# Patient Record
Sex: Female | Born: 1970
Health system: Southern US, Community
[De-identification: ages and names within clinical notes are randomized; demographics above are authoritative.]

## PROBLEM LIST (undated history)

## (undated) DIAGNOSIS — R112 Nausea with vomiting, unspecified: Secondary | ICD-10-CM

## (undated) DIAGNOSIS — O24419 Gestational diabetes mellitus in pregnancy, unspecified control: Secondary | ICD-10-CM

## (undated) DIAGNOSIS — A630 Anogenital (venereal) warts: Secondary | ICD-10-CM

## (undated) DIAGNOSIS — Z9889 Other specified postprocedural states: Secondary | ICD-10-CM

## (undated) DIAGNOSIS — J020 Streptococcal pharyngitis: Secondary | ICD-10-CM

## (undated) HISTORY — DX: Anogenital (venereal) warts: A63.0

## (undated) HISTORY — PX: PILONIDAL CYST EXCISION: SHX744

## (undated) HISTORY — PX: COSMETIC SURGERY: SHX468

---
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
## 1993-01-09 HISTORY — PX: WISDOM TOOTH EXTRACTION: SHX21

## 1998-01-09 DIAGNOSIS — A630 Anogenital (venereal) warts: Secondary | ICD-10-CM

## 1998-01-09 HISTORY — DX: Anogenital (venereal) warts: A63.0

## 2003-10-30 ENCOUNTER — Other Ambulatory Visit: Admission: RE | Admit: 2003-10-30 | Discharge: 2003-10-30 | Payer: Self-pay | Admitting: Obstetrics and Gynecology

## 2004-01-20 ENCOUNTER — Inpatient Hospital Stay (HOSPITAL_COMMUNITY): Admission: AD | Admit: 2004-01-20 | Discharge: 2004-01-20 | Payer: Self-pay | Admitting: Obstetrics and Gynecology

## 2004-03-15 ENCOUNTER — Inpatient Hospital Stay (HOSPITAL_COMMUNITY): Admission: AD | Admit: 2004-03-15 | Discharge: 2004-03-15 | Payer: Self-pay | Admitting: Obstetrics and Gynecology

## 2004-05-27 ENCOUNTER — Inpatient Hospital Stay (HOSPITAL_COMMUNITY): Admission: AD | Admit: 2004-05-27 | Discharge: 2004-05-29 | Payer: Self-pay | Admitting: Obstetrics & Gynecology

## 2004-07-04 ENCOUNTER — Other Ambulatory Visit: Admission: RE | Admit: 2004-07-04 | Discharge: 2004-07-04 | Payer: Self-pay | Admitting: Obstetrics and Gynecology

## 2007-01-21 ENCOUNTER — Emergency Department (HOSPITAL_COMMUNITY): Admission: EM | Admit: 2007-01-21 | Discharge: 2007-01-21 | Payer: Self-pay | Admitting: Family Medicine

## 2007-09-03 ENCOUNTER — Encounter: Admission: RE | Admit: 2007-09-03 | Discharge: 2007-09-03 | Payer: Self-pay | Admitting: Obstetrics and Gynecology

## 2010-05-27 NOTE — Op Note (Signed)
Breanna Tran, Breanna Tran              ACCOUNT NO.:  [id number]   MEDICAL RECORD NO.:  [id number]          PATIENT TYPE:  INP   LOCATION:  9137                          FACILITY:  [facility]   PHYSICIAN:  Guy Sandifer. Henderson Cloud, M.D. DATE OF BIRTH:  1970/03/30   DATE OF PROCEDURE:  05/28/2004  DATE OF DISCHARGE:                                 OPERATIVE REPORT   PROCEDURE:  Vacuum extraction.   INDICATIONS AND CONSENT:  This patient is a 40 year old married white  female, G1, P0, with an EDC of May 23, 2004, who was admitted for induction  of labor.  She progressed to complete and pushing to a +3 station.  After 2-  1/2 hours, she was fatigued.  Options were discussed.  Vacuum extraction  with a one in 40,000 severe morbidity and mortality is discussed.  All  questions were answered.   The patient was prepped for delivery.  Foley catheter had been removed  approximately 45 minutes earlier.  She was straight-catheterized.  The Kiwi  vacuum extractor is placed on the vertex with the suction being believed  between contractions.  Over the course of approximately four to five  contractions, the vertex descends well.  There is one pop-off.  The vertex  is then delivered in an OA position.  The oropharynx and nasopharynx are  suctioned.  A nuchal cord x1 is noted.  However, it is too tight to reduce  and the baby is delivered through it.  The shoulders are delivered without  difficulty.  A viable female infant, Apgars of 8 and 9 at one and five  minutes, respectively, is obtained.  Birth weight and cord pH are pending.  The placenta is three-vessel and intact.  Estimated blood loss is 500 mL.  A  midline episiotomy was performed.  There is a third degree extension just  into the capsule of the rectal sphincter.  There is a small approximately 2  cm left sulcus extension as well.  This is repaired in layers with 2-0  Rapide suture.  The patient and infant are stable in the labor and delivery   room.      JET/MEDQ  D:  05/27/2004  T:  05/28/2004  Job:  [id number]

## 2010-11-09 ENCOUNTER — Emergency Department: Payer: Self-pay | Admitting: *Deleted

## 2012-04-09 ENCOUNTER — Other Ambulatory Visit: Payer: Self-pay

## 2012-04-10 ENCOUNTER — Other Ambulatory Visit: Payer: Self-pay

## 2012-04-30 ENCOUNTER — Ambulatory Visit (INDEPENDENT_AMBULATORY_CARE_PROVIDER_SITE_OTHER): Payer: BC Managed Care – PPO | Admitting: Family Medicine

## 2012-04-30 ENCOUNTER — Encounter: Payer: Self-pay | Admitting: Family Medicine

## 2012-04-30 VITALS — BP 115/66 | HR 55 | Ht 68.0 in | Wt 182.0 lb

## 2012-04-30 DIAGNOSIS — O039 Complete or unspecified spontaneous abortion without complication: Secondary | ICD-10-CM | POA: Insufficient documentation

## 2012-04-30 NOTE — Progress Notes (Signed)
  Subjective:    Patient ID: Breanna Tran, female    DOB: 1970-07-07, 42 y.o.   MRN: [id number]  HPI New pt. Today.  2 recent miscarriages.  Believes she passed everything on 04/06/12.  Has not gotten a cycle back, but does not feel pregnant.  She is having problems with depression and feeling sad.  She is interested in having her husband get a vasectomy.  States she cannot go through another miscarriage.  Interested in having another baby, only if we can guarantee, everything will be normal. Past Medical History  Diagnosis Date  . Genital warts due to HPV (human papillomavirus) 2000    wart removed.    Past Surgical History  Procedure Laterality Date  . Appendectomy  1993  . Wisdom tooth extraction  1995     Medication List       These changes are accurate as of: 04/30/2012  9:12 AM. If you have any questions, ask your nurse or doctor.          TAKE these medications       glucosamine-chondroitin 500-400 MG tablet  Take 1 tablet by mouth 3 (three) times daily.       No Known Allergies Family History  Problem Relation Age of Onset  . Cancer Father     renal  . Cancer Maternal Grandfather     lung   History   Social History  . Marital Status: Married    Spouse Name: N/A    Number of Children: N/A  . Years of Education: N/A   Occupational History  . Not on file.   Social History Main Topics  . Smoking status: Former Smoker -- 0.50 packs/day for 15 years    Quit date: 01/10/2012  . Smokeless tobacco: Never Used  . Alcohol Use: 0.6 oz/week    1 Glasses of wine per week  . Drug Use: No  . Sexually Active: Yes    Birth Control/ Protection: Condom   Other Topics Concern  . Not on file   Social History Narrative  . No narrative on file       Review of Systems  Constitutional: Negative for fever and appetite change.  Respiratory: Negative for chest tightness, shortness of breath and wheezing.   Cardiovascular: Negative for chest pain.   Gastrointestinal: Negative for diarrhea and abdominal distention.  Genitourinary: Negative for dysuria.  Skin: Negative for rash.  Neurological: Negative for light-headedness.  Psychiatric/Behavioral: Positive for dysphoric mood. Negative for suicidal ideas and self-injury. The patient is not nervous/anxious.        Objective:   Physical Exam  Vitals reviewed. Constitutional: She is oriented to person, place, and time. She appears well-developed and well-nourished.  HENT:  Head: Normocephalic and atraumatic.  Eyes: No scleral icterus.  Neck: Neck supple.  Cardiovascular: Normal rate.   Pulmonary/Chest: Effort normal.  Abdominal: Soft.  Musculoskeletal: Normal range of motion.  Neurological: She is alert and oriented to person, place, and time.  Skin: Skin is warm and dry.  Psychiatric: She does not exhibit a depressed mood.  Tearful          Assessment & Plan:

## 2012-04-30 NOTE — Assessment & Plan Note (Signed)
Advice given at length.  Counseling done for a very long time about normal progression, when to expect her cycle, if not back in one week return for UPT and u/s.  Also discussed need for anti-depressants if not feeling better.  Advised that making permanent life decisions during this time is not good.  She needs an annual, pap smear and mammogram. Will schedule her back for this in 4 wks.

## 2012-04-30 NOTE — Patient Instructions (Signed)
Miscarriage A miscarriage is the sudden loss of an unborn baby (fetus) before the 20th week of pregnancy. Most miscarriages happen in the first 3 months of pregnancy. Sometimes, it happens before a woman even knows she is pregnant. A miscarriage is also called a "spontaneous miscarriage" or "early pregnancy loss." Having a miscarriage can be an emotional experience. Talk with your caregiver about any questions you may have about miscarrying, the grieving process, and your future pregnancy plans. CAUSES   Problems with the fetal chromosomes that make it impossible for the baby to develop normally. Problems with the baby's genes or chromosomes are most often the result of errors that occur, by chance, as the embryo divides and grows. The problems are not inherited from the parents.  Infection of the cervix or uterus.   Hormone problems.   Problems with the cervix, such as having an incompetent cervix. This is when the tissue in the cervix is not strong enough to hold the pregnancy.   Problems with the uterus, such as an abnormally shaped uterus, uterine fibroids, or congenital abnormalities.   Certain medical conditions.   Smoking, drinking alcohol, or taking illegal drugs.   Trauma.  Often, the cause of a miscarriage is unknown.  SYMPTOMS   Vaginal bleeding or spotting, with or without cramps or pain.  Pain or cramping in the abdomen or lower back.  Passing fluid, tissue, or blood clots from the vagina. DIAGNOSIS  Your caregiver will perform a physical exam. You may also have an ultrasound to confirm the miscarriage. Blood or urine tests may also be ordered. TREATMENT   Sometimes, treatment is not necessary if you naturally pass all the fetal tissue that was in the uterus. If some of the fetus or placenta remains in the body (incomplete miscarriage), tissue left behind may become infected and must be removed. Usually, a dilation and curettage (D and C) procedure is performed.  During a D and C procedure, the cervix is widened (dilated) and any remaining fetal or placental tissue is gently removed from the uterus.  Antibiotic medicines are prescribed if there is an infection. Other medicines may be given to reduce the size of the uterus (contract) if there is a lot of bleeding.  If you have Rh negative blood and your baby was Rh positive, you will need a Rh immunoglobulin shot. This shot will protect any future baby from having Rh blood problems in future pregnancies. HOME CARE INSTRUCTIONS   Your caregiver may order bed rest or may allow you to continue light activity. Resume activity as directed by your caregiver.  Have someone help with home and family responsibilities during this time.   Keep track of the number of sanitary pads you use each day and how soaked (saturated) they are. Write down this information.   Do not use tampons. Do not douche or have sexual intercourse until approved by your caregiver.   Only take over-the-counter or prescription medicines for pain or discomfort as directed by your caregiver.   Do not take aspirin. Aspirin can cause bleeding.   Keep all follow-up appointments with your caregiver.   If you or your partner have problems with grieving, talk to your caregiver or seek counseling to help cope with the pregnancy loss. Allow enough time to grieve before trying to get pregnant again.  SEEK IMMEDIATE MEDICAL CARE IF:   You have severe cramps or pain in your back or abdomen.  You have a fever.  You pass large blood clots (walnut-sized   or larger) ortissue from your vagina. Save any tissue for your caregiver to inspect.   Your bleeding increases.   You have a thick, bad-smelling vaginal discharge.  You become lightheaded, weak, or you faint.   You have chills.  MAKE SURE YOU:  Understand these instructions.  Will watch your condition.  Will get help right away if you are not doing well or get  worse. Document Released: 06/21/2000 Document Revised: 06/27/2011 Document Reviewed: 02/14/2011 [employer] Patient Information 2013 [employer].  

## 2012-04-30 NOTE — Progress Notes (Signed)
Here today for follow from [facility], she thinks she passed everything on 04/06/12, she did not go to the office or hospital.  LMP was 01/30/12.  History of [facility] 11/09/10.

## 2012-05-30 ENCOUNTER — Encounter: Payer: Self-pay | Admitting: Family Medicine

## 2012-05-30 ENCOUNTER — Ambulatory Visit (INDEPENDENT_AMBULATORY_CARE_PROVIDER_SITE_OTHER): Payer: BC Managed Care – PPO | Admitting: Family Medicine

## 2012-05-30 VITALS — BP 115/74 | HR 74 | Resp 16 | Ht 68.0 in | Wt 183.0 lb

## 2012-05-30 DIAGNOSIS — Z01419 Encounter for gynecological examination (general) (routine) without abnormal findings: Secondary | ICD-10-CM

## 2012-05-30 DIAGNOSIS — Z720 Tobacco use: Secondary | ICD-10-CM | POA: Insufficient documentation

## 2012-05-30 DIAGNOSIS — Z124 Encounter for screening for malignant neoplasm of cervix: Secondary | ICD-10-CM

## 2012-05-30 DIAGNOSIS — F17200 Nicotine dependence, unspecified, uncomplicated: Secondary | ICD-10-CM

## 2012-05-30 DIAGNOSIS — Z1151 Encounter for screening for human papillomavirus (HPV): Secondary | ICD-10-CM

## 2012-05-30 LAB — CBC
HCT: 41.9 % (ref 36.0–46.0)
Hemoglobin: 14 g/dL (ref 12.0–15.0)
MCH: 28.7 pg (ref 26.0–34.0)
MCV: 86 fL (ref 78.0–100.0)
RBC: 4.87 MIL/uL (ref 3.87–5.11)
WBC: 7.1 10*3/uL (ref 4.0–10.5)

## 2012-05-30 LAB — COMPREHENSIVE METABOLIC PANEL
ALT: 16 U/L (ref 0–35)
Alkaline Phosphatase: 51 U/L (ref 39–117)
Creat: 0.81 mg/dL (ref 0.50–1.10)
Sodium: 141 mEq/L (ref 135–145)
Total Bilirubin: 0.3 mg/dL (ref 0.3–1.2)
Total Protein: 6.9 g/dL (ref 6.0–8.3)

## 2012-05-30 LAB — LIPID PANEL
HDL: 41 mg/dL (ref >39)
LDL Cholesterol: 90 mg/dL (ref 0–99)
Total CHOL/HDL Ratio: 4.1 Ratio
VLDL: 36 mg/dL (ref 0–40)

## 2012-05-30 LAB — TSH: TSH: 1.591 u[IU]/mL (ref 0.350–4.500)

## 2012-05-30 NOTE — Progress Notes (Signed)
  Subjective:     Breanna Tran is a 42 y.o. female and is here for a comprehensive physical exam. The patient reports no problems.  History   Social History  . Marital Status: Married    Spouse Name: N/A    Number of Children: N/A  . Years of Education: N/A   Occupational History  . Not on file.   Social History Main Topics  . Smoking status: Former Smoker -- 0.50 packs/day for 15 years    Quit date: 01/10/2012  . Smokeless tobacco: Never Used  . Alcohol Use: 0.6 oz/week    1 Glasses of wine per week  . Drug Use: No  . Sexually Active: Yes    Birth Control/ Protection: Condom   Other Topics Concern  . Not on file   Social History Narrative  . No narrative on file   Health Maintenance  Topic Date Due  . Pap Smear  03/29/1990  . Tetanus/tdap  03/29/1991  . Influenza Vaccine  09/09/2012    The following portions of the patient's history were reviewed and updated as appropriate: allergies, current medications, past family history, past medical history, past social history, past surgical history and problem list.  Review of Systems A comprehensive review of systems was negative.   Objective:    BP 115/74  Pulse 74  Resp 16  Ht 5\' 8"  (1.727 m)  Wt 183 lb (83.008 kg)  BMI 27.83 kg/m2  LMP 05/10/2012  Breastfeeding? No General appearance: alert, cooperative and appears stated age Head: Normocephalic, without obvious abnormality, atraumatic Neck: no adenopathy, supple, symmetrical, trachea midline and thyroid not enlarged, symmetric, no tenderness/mass/nodules Lungs: clear to auscultation bilaterally Breasts: normal appearance, no masses or tenderness Heart: regular rate and rhythm, S1, S2 normal, no murmur, click, rub or gallop Abdomen: soft, non-tender; bowel sounds normal; no masses,  no organomegaly Pelvic: cervix normal in appearance, external genitalia normal, no adnexal masses or tenderness, no cervical motion tenderness, uterus normal size, shape, and  consistency and vagina normal without discharge Extremities: extremities normal, atraumatic, no cyanosis or edema Pulses: 2+ and symmetric Skin: Skin color, texture, turgor normal. No rashes or lesions Lymph nodes: Cervical, supraclavicular, and axillary nodes normal. Neurologic: Grossly normal    Assessment:    Healthy female exam. Husband is considering vasectomy.      Plan:    Pap smear today. Annual blood work Biochemist, clinical. See After Visit Summary for Counseling Recommendations

## 2012-05-30 NOTE — Patient Instructions (Signed)
Preventive Care for Adults, Female A healthy lifestyle and preventive care can promote health and wellness. Preventive health guidelines for women include the following key practices.  A routine yearly physical is a good way to check with your caregiver about your health and preventive screening. It is a chance to share any concerns and updates on your health, and to receive a thorough exam.  Visit your dentist for a routine exam and preventive care every 6 months. Brush your teeth twice a day and floss once a day. Good oral hygiene prevents tooth decay and gum disease.  The frequency of eye exams is based on your age, health, family medical history, use of contact lenses, and other factors. Follow your caregiver's recommendations for frequency of eye exams.  Eat a healthy diet. Foods like vegetables, fruits, whole grains, low-fat dairy products, and lean protein foods contain the nutrients you need without too many calories. Decrease your intake of foods high in solid fats, added sugars, and salt. Eat the right amount of calories for you.Get information about a proper diet from your caregiver, if necessary.  Regular physical exercise is one of the most important things you can do for your health. Most adults should get at least 150 minutes of moderate-intensity exercise (any activity that increases your heart rate and causes you to sweat) each week. In addition, most adults need muscle-strengthening exercises on 2 or more days a week.  Maintain a healthy weight. The body mass index (BMI) is a screening tool to identify possible weight problems. It provides an estimate of body fat based on height and weight. Your caregiver can help determine your BMI, and can help you achieve or maintain a healthy weight.For adults 20 years and older:  A BMI below 18.5 is considered underweight.  A BMI of 18.5 to 24.9 is normal.  A BMI of 25 to 29.9 is considered overweight.  A BMI of 30 and above is  considered obese.  Maintain normal blood lipids and cholesterol levels by exercising and minimizing your intake of saturated fat. Eat a balanced diet with plenty of fruit and vegetables. Blood tests for lipids and cholesterol should begin at age 20 and be repeated every 5 years. If your lipid or cholesterol levels are high, you are over 50, or you are at high risk for heart disease, you may need your cholesterol levels checked more frequently.Ongoing high lipid and cholesterol levels should be treated with medicines if diet and exercise are not effective.  If you smoke, find out from your caregiver how to quit. If you do not use tobacco, do not start.  If you are pregnant, do not drink alcohol. If you are breastfeeding, be very cautious about drinking alcohol. If you are not pregnant and choose to drink alcohol, do not exceed 1 drink per day. One drink is considered to be 12 ounces (355 mL) of beer, 5 ounces (148 mL) of wine, or 1.5 ounces (44 mL) of liquor.  Avoid use of street drugs. Do not share needles with anyone. Ask for help if you need support or instructions about stopping the use of drugs.  High blood pressure causes heart disease and increases the risk of stroke. Your blood pressure should be checked at least every 1 to 2 years. Ongoing high blood pressure should be treated with medicines if weight loss and exercise are not effective.  If you are 55 to 40 years old, ask your caregiver if you should take aspirin to prevent strokes.  Diabetes   screening involves taking a blood sample to check your fasting blood sugar level. This should be done once every 3 years, after age 45, if you are within normal weight and without risk factors for diabetes. Testing should be considered at a younger age or be carried out more frequently if you are overweight and have at least 1 risk factor for diabetes.  Breast cancer screening is essential preventive care for women. You should practice "breast  self-awareness." This means understanding the normal appearance and feel of your breasts and may include breast self-examination. Any changes detected, no matter how small, should be reported to a caregiver. Women in their 20s and 30s should have a clinical breast exam (CBE) by a caregiver as part of a regular health exam every 1 to 3 years. After age 40, women should have a CBE every year. Starting at age 40, women should consider having a mammography (breast X-ray test) every year. Women who have a family history of breast cancer should talk to their caregiver about genetic screening. Women at a high risk of breast cancer should talk to their caregivers about having magnetic resonance imaging (MRI) and a mammography every year.  The Pap test is a screening test for cervical cancer. A Pap test can show cell changes on the cervix that might become cervical cancer if left untreated. A Pap test is a procedure in which cells are obtained and examined from the lower end of the uterus (cervix).  Women should have a Pap test starting at age 21.  Between ages 21 and 29, Pap tests should be repeated every 2 years.  Beginning at age 30, you should have a Pap test every 3 years as long as the past 3 Pap tests have been normal.  Some women have medical problems that increase the chance of getting cervical cancer. Talk to your caregiver about these problems. It is especially important to talk to your caregiver if a new problem develops soon after your last Pap test. In these cases, your caregiver may recommend more frequent screening and Pap tests.  The above recommendations are the same for women who have or have not gotten the vaccine for human papillomavirus (HPV).  If you had a hysterectomy for a problem that was not cancer or a condition that could lead to cancer, then you no longer need Pap tests. Even if you no longer need a Pap test, a regular exam is a good idea to make sure no other problems are  starting.  If you are between ages 65 and 70, and you have had normal Pap tests going back 10 years, you no longer need Pap tests. Even if you no longer need a Pap test, a regular exam is a good idea to make sure no other problems are starting.  If you have had past treatment for cervical cancer or a condition that could lead to cancer, you need Pap tests and screening for cancer for at least 20 years after your treatment.  If Pap tests have been discontinued, risk factors (such as a new sexual partner) need to be reassessed to determine if screening should be resumed.  The HPV test is an additional test that may be used for cervical cancer screening. The HPV test looks for the virus that can cause the cell changes on the cervix. The cells collected during the Pap test can be tested for HPV. The HPV test could be used to screen women aged 30 years and older, and should   be used in women of any age who have unclear Pap test results. After the age of 30, women should have HPV testing at the same frequency as a Pap test.  Colorectal cancer can be detected and often prevented. Most routine colorectal cancer screening begins at the age of 50 and continues through age 75. However, your caregiver may recommend screening at an earlier age if you have risk factors for colon cancer. On a yearly basis, your caregiver may provide home test kits to check for hidden blood in the stool. Use of a small camera at the end of a tube, to directly examine the colon (sigmoidoscopy or colonoscopy), can detect the earliest forms of colorectal cancer. Talk to your caregiver about this at age 50, when routine screening begins. Direct examination of the colon should be repeated every 5 to 10 years through age 75, unless early forms of pre-cancerous polyps or small growths are found.  Hepatitis C blood testing is recommended for all people born from 1945 through 1965 and any individual with known risks for hepatitis C.  Practice  safe sex. Use condoms and avoid high-risk sexual practices to reduce the spread of sexually transmitted infections (STIs). STIs include gonorrhea, chlamydia, syphilis, trichomonas, herpes, HPV, and human immunodeficiency virus (HIV). Herpes, HIV, and HPV are viral illnesses that have no cure. They can result in disability, cancer, and death. Sexually active women aged 25 and younger should be checked for chlamydia. Older women with new or multiple partners should also be tested for chlamydia. Testing for other STIs is recommended if you are sexually active and at increased risk.  Osteoporosis is a disease in which the bones lose minerals and strength with aging. This can result in serious bone fractures. The risk of osteoporosis can be identified using a bone density scan. Women ages 65 and over and women at risk for fractures or osteoporosis should discuss screening with their caregivers. Ask your caregiver whether you should take a calcium supplement or vitamin D to reduce the rate of osteoporosis.  Menopause can be associated with physical symptoms and risks. Hormone replacement therapy is available to decrease symptoms and risks. You should talk to your caregiver about whether hormone replacement therapy is right for you.  Use sunscreen with sun protection factor (SPF) of 30 or more. Apply sunscreen liberally and repeatedly throughout the day. You should seek shade when your shadow is shorter than you. Protect yourself by wearing long sleeves, pants, a wide-brimmed hat, and sunglasses year round, whenever you are outdoors.  Once a month, do a whole body skin exam, using a mirror to look at the skin on your back. Notify your caregiver of new moles, moles that have irregular borders, moles that are larger than a pencil eraser, or moles that have changed in shape or color.  Stay current with required immunizations.  Influenza. You need a dose every fall (or winter). The composition of the flu vaccine  changes each year, so being vaccinated once is not enough.  Pneumococcal polysaccharide. You need 1 to 2 doses if you smoke cigarettes or if you have certain chronic medical conditions. You need 1 dose at age 65 (or older) if you have never been vaccinated.  Tetanus, diphtheria, pertussis (Tdap, Td). Get 1 dose of Tdap vaccine if you are younger than age 65, are over 65 and have contact with an infant, are a healthcare worker, are pregnant, or simply want to be protected from whooping cough. After that, you need a Td   booster dose every 10 years. Consult your caregiver if you have not had at least 3 tetanus and diphtheria-containing shots sometime in your life or have a deep or dirty wound.  HPV. You need this vaccine if you are a woman age 26 or younger. The vaccine is given in 3 doses over 6 months.  Measles, mumps, rubella (MMR). You need at least 1 dose of MMR if you were born in 1957 or later. You may also need a second dose.  Meningococcal. If you are age 19 to 21 and a first-year college student living in a residence hall, or have one of several medical conditions, you need to get vaccinated against meningococcal disease. You may also need additional booster doses.  Zoster (shingles). If you are age 60 or older, you should get this vaccine.  Varicella (chickenpox). If you have never had chickenpox or you were vaccinated but received only 1 dose, talk to your caregiver to find out if you need this vaccine.  Hepatitis A. You need this vaccine if you have a specific risk factor for hepatitis A virus infection or you simply wish to be protected from this disease. The vaccine is usually given as 2 doses, 6 to 18 months apart.  Hepatitis B. You need this vaccine if you have a specific risk factor for hepatitis B virus infection or you simply wish to be protected from this disease. The vaccine is given in 3 doses, usually over 6 months. Preventive Services / Frequency Ages 19 to 39  Blood  pressure check.** / Every 1 to 2 years.  Lipid and cholesterol check.** / Every 5 years beginning at age 20.  Clinical breast exam.** / Every 3 years for women in their 20s and 30s.  Pap test.** / Every 2 years from ages 21 through 29. Every 3 years starting at age 30 through age 65 or 70 with a history of 3 consecutive normal Pap tests.  HPV screening.** / Every 3 years from ages 30 through ages 65 to 70 with a history of 3 consecutive normal Pap tests.  Hepatitis C blood test.** / For any individual with known risks for hepatitis C.  Skin self-exam. / Monthly.  Influenza immunization.** / Every year.  Pneumococcal polysaccharide immunization.** / 1 to 2 doses if you smoke cigarettes or if you have certain chronic medical conditions.  Tetanus, diphtheria, pertussis (Tdap, Td) immunization. / A one-time dose of Tdap vaccine. After that, you need a Td booster dose every 10 years.  HPV immunization. / 3 doses over 6 months, if you are 26 and younger.  Measles, mumps, rubella (MMR) immunization. / You need at least 1 dose of MMR if you were born in 1957 or later. You may also need a second dose.  Meningococcal immunization. / 1 dose if you are age 19 to 21 and a first-year college student living in a residence hall, or have one of several medical conditions, you need to get vaccinated against meningococcal disease. You may also need additional booster doses.  Varicella immunization.** / Consult your caregiver.  Hepatitis A immunization.** / Consult your caregiver. 2 doses, 6 to 18 months apart.  Hepatitis B immunization.** / Consult your caregiver. 3 doses usually over 6 months. Ages 40 to 64  Blood pressure check.** / Every 1 to 2 years.  Lipid and cholesterol check.** / Every 5 years beginning at age 20.  Clinical breast exam.** / Every year after age 40.  Mammogram.** / Every year beginning at age 40   and continuing for as long as you are in good health. Consult with your  caregiver.  Pap test.** / Every 3 years starting at age 30 through age 65 or 70 with a history of 3 consecutive normal Pap tests.  HPV screening.** / Every 3 years from ages 30 through ages 65 to 70 with a history of 3 consecutive normal Pap tests.  Fecal occult blood test (FOBT) of stool. / Every year beginning at age 50 and continuing until age 75. You may not need to do this test if you get a colonoscopy every 10 years.  Flexible sigmoidoscopy or colonoscopy.** / Every 5 years for a flexible sigmoidoscopy or every 10 years for a colonoscopy beginning at age 50 and continuing until age 75.  Hepatitis C blood test.** / For all people born from 1945 through 1965 and any individual with known risks for hepatitis C.  Skin self-exam. / Monthly.  Influenza immunization.** / Every year.  Pneumococcal polysaccharide immunization.** / 1 to 2 doses if you smoke cigarettes or if you have certain chronic medical conditions.  Tetanus, diphtheria, pertussis (Tdap, Td) immunization.** / A one-time dose of Tdap vaccine. After that, you need a Td booster dose every 10 years.  Measles, mumps, rubella (MMR) immunization. / You need at least 1 dose of MMR if you were born in 1957 or later. You may also need a second dose.  Varicella immunization.** / Consult your caregiver.  Meningococcal immunization.** / Consult your caregiver.  Hepatitis A immunization.** / Consult your caregiver. 2 doses, 6 to 18 months apart.  Hepatitis B immunization.** / Consult your caregiver. 3 doses, usually over 6 months. Ages 65 and over  Blood pressure check.** / Every 1 to 2 years.  Lipid and cholesterol check.** / Every 5 years beginning at age 20.  Clinical breast exam.** / Every year after age 40.  Mammogram.** / Every year beginning at age 40 and continuing for as long as you are in good health. Consult with your caregiver.  Pap test.** / Every 3 years starting at age 30 through age 65 or 70 with a 3  consecutive normal Pap tests. Testing can be stopped between 65 and 70 with 3 consecutive normal Pap tests and no abnormal Pap or HPV tests in the past 10 years.  HPV screening.** / Every 3 years from ages 30 through ages 65 or 70 with a history of 3 consecutive normal Pap tests. Testing can be stopped between 65 and 70 with 3 consecutive normal Pap tests and no abnormal Pap or HPV tests in the past 10 years.  Fecal occult blood test (FOBT) of stool. / Every year beginning at age 50 and continuing until age 75. You may not need to do this test if you get a colonoscopy every 10 years.  Flexible sigmoidoscopy or colonoscopy.** / Every 5 years for a flexible sigmoidoscopy or every 10 years for a colonoscopy beginning at age 50 and continuing until age 75.  Hepatitis C blood test.** / For all people born from 1945 through 1965 and any individual with known risks for hepatitis C.  Osteoporosis screening.** / A one-time screening for women ages 65 and over and women at risk for fractures or osteoporosis.  Skin self-exam. / Monthly.  Influenza immunization.** / Every year.  Pneumococcal polysaccharide immunization.** / 1 dose at age 65 (or older) if you have never been vaccinated.  Tetanus, diphtheria, pertussis (Tdap, Td) immunization. / A one-time dose of Tdap vaccine if you are over   65 and have contact with an infant, are a healthcare worker, or simply want to be protected from whooping cough. After that, you need a Td booster dose every 10 years.  Varicella immunization.** / Consult your caregiver.  Meningococcal immunization.** / Consult your caregiver.  Hepatitis A immunization.** / Consult your caregiver. 2 doses, 6 to 18 months apart.  Hepatitis B immunization.** / Check with your caregiver. 3 doses, usually over 6 months. ** Family history and personal history of risk and conditions may change your caregiver's recommendations. Document Released: 02/21/2001 Document Revised: 03/20/2011  Document Reviewed: 05/23/2010 [employer] Patient Information 2014 [employer].  

## 2012-11-07 ENCOUNTER — Other Ambulatory Visit: Payer: Self-pay

## 2012-11-07 DIAGNOSIS — Z1231 Encounter for screening mammogram for malignant neoplasm of breast: Secondary | ICD-10-CM

## 2012-12-13 ENCOUNTER — Ambulatory Visit
Admission: RE | Admit: 2012-12-13 | Discharge: 2012-12-13 | Disposition: A | Discharge status: Home / Self Care | Payer: BC Managed Care – PPO | Source: Ambulatory Visit

## 2012-12-13 ENCOUNTER — Ambulatory Visit: Payer: BC Managed Care – PPO

## 2012-12-13 DIAGNOSIS — Z1231 Encounter for screening mammogram for malignant neoplasm of breast: Secondary | ICD-10-CM

## 2013-11-10 ENCOUNTER — Encounter: Payer: Self-pay | Admitting: Family Medicine

## 2015-01-27 ENCOUNTER — Ambulatory Visit: Payer: Self-pay | Admitting: Obstetrics and Gynecology

## 2015-06-28 DIAGNOSIS — L814 Other melanin hyperpigmentation: Secondary | ICD-10-CM | POA: Diagnosis not present

## 2015-08-31 ENCOUNTER — Ambulatory Visit (INDEPENDENT_AMBULATORY_CARE_PROVIDER_SITE_OTHER): Payer: BLUE CROSS/BLUE SHIELD | Admitting: Internal Medicine

## 2015-08-31 ENCOUNTER — Ambulatory Visit: Payer: Self-pay | Admitting: Internal Medicine

## 2015-08-31 ENCOUNTER — Encounter: Payer: Self-pay | Admitting: Internal Medicine

## 2015-08-31 VITALS — BP 108/72 | HR 90 | Temp 98.8°F | Ht 65.5 in | Wt 154.8 lb

## 2015-08-31 DIAGNOSIS — B80 Enterobiasis: Secondary | ICD-10-CM | POA: Diagnosis not present

## 2015-08-31 MED ORDER — ALBENDAZOLE 200 MG PO TABS: 400.0000 mg | ORAL_TABLET | Freq: Once | ORAL | 0 refills | Status: AC

## 2015-08-31 NOTE — Progress Notes (Signed)
HPI  Pt presents to the clinic today to establish care. She has not had a PCP in many years.  Flu: never Tetanus: 2009 Pap Smear: 2014 Mammogram: 2014 Vision Screening: as needed Dentist: as needed.  She is c/o rectal itching, nausea and intermittent diarrhea. This started 2 weeks ago. This morning, as she was wiping, she noticed a long, white, skinny worm in her stool. She is concerned that she has pinworms and is requesting treatment today.   Past Medical History:  Diagnosis Date  . Genital warts due to HPV (human papillomavirus) 2000   wart removed.     Current Outpatient Prescriptions  Medication Sig Dispense Refill  . Multiple Vitamin (MULTIVITAMIN) tablet Take 1 tablet by mouth daily.    . PSYLLIUM HUSK PO Take 5 capsules by mouth daily.     No current facility-administered medications for this visit.     No Known Allergies  Family History  Problem Relation Age of Onset  . Cancer Father     renal  . Cancer Maternal Grandfather     lung  . Graves' disease Mother   . Brain cancer Maternal Uncle     Social History   Social History  . Marital status: Married    Spouse name: N/A  . Number of children: N/A  . Years of education: N/A   Occupational History  . Not on file.   Social History Main Topics  . Smoking status: Former Smoker    Packs/day: 0.50    Years: 15.00    Quit date: 01/10/2012  . Smokeless tobacco: Never Used  . Alcohol use 0.6 oz/week    1 Glasses of wine per week     Comment: rare  . Drug use: No  . Sexual activity: Yes    Birth control/ protection: Condom   Other Topics Concern  . Not on file   Social History Narrative  . No narrative on file    ROS:  Constitutional: Denies fever, malaise, fatigue, headache or abrupt weight changes.  Respiratory: Denies difficulty breathing, shortness of breath, cough or sputum production.   Cardiovascular: Denies chest pain, chest tightness, palpitations or swelling in the hands or feet.   Gastrointestinal: Pt reports nausea, diarrhea and rectal itching. Denies abdominal pain, bloating, constipation, or blood in the stool.  Skin: Denies redness, rashes, lesions or ulcercations.  Neurological: Denies dizziness, difficulty with memory, difficulty with speech or problems with balance and coordination.  Psych: Denies anxiety, depression, SI/HI.  No other specific complaints in a complete review of systems (except as listed in HPI above).  PE:  BP 108/72   Pulse 90   Temp 98.8 F (37.1 C) (Oral)   Ht 5' 5.5" (1.664 m)   Wt 154 lb 12 oz (70.2 kg)   LMP 08/25/2015   SpO2 98%   BMI 25.36 kg/m  Wt Readings from Last 3 Encounters:  08/31/15 154 lb 12 oz (70.2 kg)  05/30/12 183 lb (83 kg)  04/30/12 182 lb (82.6 kg)    General: Appears her stated age, well developed, well nourished in NAD. Cardiovascular: Normal rate and rhythm. S1,S2 noted.  No murmur, rubs or gallops noted. Pulmonary/Chest: Normal effort and positive vesicular breath sounds. No respiratory distress. No wheezes, rales or ronchi noted.  Abdomen: Soft and nontender. Normal bowel sounds. No distention or masses noted.  Rectal: No external mass noted. No evidence of pinworms noted. Neurological: Alert and oriented.  Psychiatric: Mood and affect normal. Behavior is normal. Judgment and thought  content normal.     BMET    Component Value Date/Time   NA 141 05/30/2012 0859   K 4.2 05/30/2012 0859   CL 107 05/30/2012 0859   CO2 25 05/30/2012 0859   GLUCOSE 95 05/30/2012 0859   BUN 10 05/30/2012 0859   CREATININE 0.81 05/30/2012 0859   CALCIUM 9.2 05/30/2012 0859    Lipid Panel     Component Value Date/Time   CHOL 167 05/30/2012 0859   TRIG 178 (H) 05/30/2012 0859   HDL 41 05/30/2012 0859   CHOLHDL 4.1 05/30/2012 0859   VLDL 36 05/30/2012 0859   LDLCALC 90 05/30/2012 0859    CBC    Component Value Date/Time   WBC 7.1 05/30/2012 0859   RBC 4.87 05/30/2012 0859   HGB 14.0 05/30/2012 0859    HCT 41.9 05/30/2012 0859   PLT 274 05/30/2012 0859   MCV 86.0 05/30/2012 0859   MCH 28.7 05/30/2012 0859   MCHC 33.4 05/30/2012 0859   RDW 14.3 05/30/2012 0859    Hgb A1C No results found for: HGBA1C   Assessment and Plan:  Pinworms:  No evidence on todays exam but symptoms c/w pinworms eRx for Albendazole 400 mg PO x 1, repeat in 2 weeks Discussed clipping and cleaning fingernails Can use Vaseline/Caster Oil 1:1 paste over rectal area 2 x day Can prophylactically treat children with Reese's Pinworm Treatment  Make an appt for your annual exam Nicki ReaperBAITY, Arnell Slivinski, NP

## 2015-08-31 NOTE — Patient Instructions (Signed)
Pyrantel oral suspension What is this medicine? PYRANTEL (pi RAN tel) is an antiparasitic. It is used to treat infections of pinworms or other parasites. This medicine may be used for other purposes; ask your health care provider or pharmacist if you have questions. What should I tell my health care provider before I take this medicine? They need to know if you have any of these conditions: -liver disease -an unusual or allergic reaction to pyrantel, other medicines, foods, dyes, or preservatives -pregnant or trying to get pregnant -breast-feeding How should I use this medicine? Take this medicine by mouth. Follow the directions on the prescription label. Shake well before using. Take on an empty stomach or with a meal, milk, or fruit juice. Use a specially marked spoon or dropper to measure each dose. Ask your pharmacist if you do not have one. Household spoons are not accurate. Take your medicine at regular intervals. Do not take your medicine more often than directed. Talk to your pediatrician regarding the use of this medicine in children. While this drug may be prescribed for children as young as 43 years old for selected conditions, precautions do apply. Overdosage: If you think you have taken too much of this medicine contact a poison control center or emergency room at once. NOTE: This medicine is only for you. Do not share this medicine with others. What if I miss a dose? This does not apply; this medicine is not for regular use. What may interact with this medicine? Do not take this medicine with any of the following medications: -piperazine This list may not describe all possible interactions. Give your health care provider a list of all the medicines, herbs, non-prescription drugs, or dietary supplements you use. Also tell them if you smoke, drink alcohol, or use illegal drugs. Some items may interact with your medicine. What should I watch for while using this medicine? Tell your  doctor or healthcare professional if your symptoms do not start to get better or if they get worse. If you have pinworms, other people in your house may need treatment. Pinworms spread easily by close contact. For several days after using this medicine, follow simple instructions to prevent re-infection and spread to others. Wash your hands, scrub your fingernails, and shower often. Wash, do not shake, bedclothes, linens, and undergarments. Keep toilet seats clean. Clean bedroom floors with a damp mop or vacuum. What side effects may I notice from receiving this medicine? Side effects that you should report to your doctor or health care professional as soon as possible: -allergic reactions like skin rash, itching or hives, swelling of the face, lips, or tongue Side effects that usually do not require medical attention (report to your doctor or health care professional if they continue or are bothersome): -diarrhea -dizziness -headache -loss of appetite -nausea, vomiting -stomach cramps, pain This list may not describe all possible side effects. Call your doctor for medical advice about side effects. You may report side effects to FDA at 1-800-FDA-1088. Where should I keep my medicine? Keep out of the reach of children. Store at room temperature between 15 and 30 degrees C (59 and 86 degrees F). Do not freeze. Protect from light. Keep container tightly closed. Throw away any unused medicine after the expiration date. NOTE: This sheet is a summary. It may not cover all possible information. If you have questions about this medicine, talk to your doctor, pharmacist, or health care provider.    2016, Elsevier/Gold Standard. (2007-07-25 12:19:10)

## 2015-09-07 ENCOUNTER — Ambulatory Visit: Payer: Self-pay | Admitting: Internal Medicine

## 2015-09-20 ENCOUNTER — Encounter: Payer: Self-pay | Admitting: Internal Medicine

## 2015-09-20 ENCOUNTER — Ambulatory Visit (INDEPENDENT_AMBULATORY_CARE_PROVIDER_SITE_OTHER): Payer: BLUE CROSS/BLUE SHIELD | Admitting: Internal Medicine

## 2015-09-20 VITALS — BP 116/70 | HR 64 | Temp 98.7°F | Ht 65.5 in | Wt 153.5 lb

## 2015-09-20 DIAGNOSIS — L659 Nonscarring hair loss, unspecified: Secondary | ICD-10-CM | POA: Diagnosis not present

## 2015-09-20 DIAGNOSIS — Z0000 Encounter for general adult medical examination without abnormal findings: Secondary | ICD-10-CM

## 2015-09-20 DIAGNOSIS — H6123 Impacted cerumen, bilateral: Secondary | ICD-10-CM | POA: Diagnosis not present

## 2015-09-20 LAB — CBC
HEMATOCRIT: 40.1 % (ref 36.0–46.0)
Hemoglobin: 13.6 g/dL (ref 12.0–15.0)
MCHC: 33.9 g/dL (ref 30.0–36.0)
MCV: 88.6 fl (ref 78.0–100.0)
Platelets: 224 10*3/uL (ref 150.0–400.0)
RBC: 4.53 Mil/uL (ref 3.87–5.11)
RDW: 12.1 % (ref 11.5–15.5)
WBC: 6.6 10*3/uL (ref 4.0–10.5)

## 2015-09-20 LAB — COMPREHENSIVE METABOLIC PANEL
ALBUMIN: 4 g/dL (ref 3.5–5.2)
ALK PHOS: 39 U/L (ref 39–117)
ALT: 10 U/L (ref 0–35)
AST: 14 U/L (ref 0–37)
BUN: 7 mg/dL (ref 6–23)
CO2: 30 mEq/L (ref 19–32)
CREATININE: 0.68 mg/dL (ref 0.40–1.20)
Calcium: 9.1 mg/dL (ref 8.4–10.5)
Chloride: 105 mEq/L (ref 96–112)
GFR: 100.15 mL/min (ref >60.00)
GLUCOSE: 87 mg/dL (ref 70–99)
POTASSIUM: 4 meq/L (ref 3.5–5.1)
SODIUM: 140 meq/L (ref 135–145)
TOTAL PROTEIN: 6.7 g/dL (ref 6.0–8.3)
Total Bilirubin: 0.4 mg/dL (ref 0.2–1.2)

## 2015-09-20 LAB — LIPID PANEL
CHOL/HDL RATIO: 4
Cholesterol: 158 mg/dL (ref 0–200)
HDL: 43.4 mg/dL (ref >39.00)
LDL Cholesterol: 93 mg/dL (ref 0–99)
NONHDL: 114.95
Triglycerides: 111 mg/dL (ref 0.0–149.0)
VLDL: 22.2 mg/dL (ref 0.0–40.0)

## 2015-09-20 LAB — TSH: TSH: 2.18 u[IU]/mL (ref 0.35–4.50)

## 2015-09-20 NOTE — Progress Notes (Signed)
Subjective:    Patient ID: Breanna Tran, female    DOB: 20-Aug-1970, 45 y.o.   MRN: [id number]  HPI  Pt presents to the clinic today for her annual exam.  Flu: never Tetanus: 2009 Pap Smear: 2014 Mammogram: 2014 Vision Screening: as needed Dentist: as needed  Diet: She does eat meat. She consumes fruits and veggies daily. She tries to avoid fried foods. She drinks mostly water. Exercise: She walks multiple days per week.  Review of Systems      Past Medical History:  Diagnosis Date  . Genital warts due to HPV (human papillomavirus) 2000   wart removed.     Current Outpatient Prescriptions  Medication Sig Dispense Refill  . Multiple Vitamin (MULTIVITAMIN) tablet Take 1 tablet by mouth daily.    Marland Kitchen OVER THE COUNTER MEDICATION Diatomaceous Earth 1 tablespoon daily    . PSYLLIUM HUSK PO Take 5 capsules by mouth daily.     No current facility-administered medications for this visit.     No Known Allergies  Family History  Problem Relation Age of Onset  . Cancer Father     renal  . Cancer Maternal Grandfather     lung  . Graves' disease Mother   . Brain cancer Maternal Uncle     Social History   Social History  . Marital status: Married    Spouse name: N/A  . Number of children: N/A  . Years of education: N/A   Occupational History  . Not on file.   Social History Main Topics  . Smoking status: Former Smoker    Packs/day: 0.50    Years: 15.00    Quit date: 01/10/2012  . Smokeless tobacco: Never Used  . Alcohol use 0.6 oz/week    1 Glasses of wine per week     Comment: rare  . Drug use: No  . Sexual activity: Yes    Birth control/ protection: Condom   Other Topics Concern  . Not on file   Social History Narrative  . No narrative on file     Constitutional: Denies fever, malaise, fatigue, headache or abrupt weight changes.  HEENT: Pt reports hair thinning. Denies eye pain, eye redness, ear pain, ringing in the ears, wax buildup, runny nose,  nasal congestion, bloody nose, or sore throat. Respiratory: Denies difficulty breathing, shortness of breath, cough or sputum production.   Cardiovascular: Denies chest pain, chest tightness, palpitations or swelling in the hands or feet.  Gastrointestinal: Pt reports occasional constipation. Denies abdominal pain, bloating,  diarrhea or blood in the stool.  GU: Denies urgency, frequency, pain with urination, burning sensation, blood in urine, odor or discharge. Musculoskeletal: Denies decrease in range of motion, difficulty with gait, muscle pain or joint pain and swelling.  Skin: Denies redness, rashes, lesions or ulcercations.  Neurological: Denies dizziness, difficulty with memory, difficulty with speech or problems with balance and coordination.  Psych: Denies anxiety, depression, SI/HI.  No other specific complaints in a complete review of systems (except as listed in HPI above).  Objective:   Physical Exam  BP 116/70   Pulse 64   Temp 98.7 F (37.1 C) (Oral)   Ht 5' 5.5" (1.664 m)   Wt 153 lb 8 oz (69.6 kg)   LMP 09/14/2015   SpO2 99%   BMI 25.16 kg/m  Wt Readings from Last 3 Encounters:  09/20/15 153 lb 8 oz (69.6 kg)  08/31/15 154 lb 12 oz (70.2 kg)  05/30/12 183 lb (83 kg)  General: Appears her stated age, well developed, well nourished in NAD. Skin: Warm, dry and intact. No rashes, lesions or ulcerations noted. HEENT: Head: normal shape and size; Eyes: sclera white, no icterus, conjunctiva pink, PERRLA and EOMs intact; Ears: Bilateral cerumen impaction; Throat/Mouth: Teeth present, mucosa pink and moist, no exudate, lesions or ulcerations noted.  Neck:  Neck supple, trachea midline. No masses, lumps or thyromegaly present.  Cardiovascular: Normal rate and rhythm. S1,S2 noted.  No murmur, rubs or gallops noted. No JVD or BLE edema.  Pulmonary/Chest: Normal effort and positive vesicular breath sounds. No respiratory distress. No wheezes, rales or ronchi noted.    Abdomen: Soft and nontender. Normal bowel sounds. No distention or masses noted. Liver, spleen and kidneys non palpable. Musculoskeletal: Normal range of motion. Strength 5/5 BUE/BLE. No difficulty with gait.  Neurological: Alert and oriented. Cranial nerves II-XII grossly intact. Coordination normal.  Psychiatric: Mood and affect normal. Behavior is normal. Judgment and thought content normal.    BMET    Component Value Date/Time   NA 141 05/30/2012 0859   K 4.2 05/30/2012 0859   CL 107 05/30/2012 0859   CO2 25 05/30/2012 0859   GLUCOSE 95 05/30/2012 0859   BUN 10 05/30/2012 0859   CREATININE 0.81 05/30/2012 0859   CALCIUM 9.2 05/30/2012 0859    Lipid Panel     Component Value Date/Time   CHOL 167 05/30/2012 0859   TRIG 178 (H) 05/30/2012 0859   HDL 41 05/30/2012 0859   CHOLHDL 4.1 05/30/2012 0859   VLDL 36 05/30/2012 0859   LDLCALC 90 05/30/2012 0859    CBC    Component Value Date/Time   WBC 7.1 05/30/2012 0859   RBC 4.87 05/30/2012 0859   HGB 14.0 05/30/2012 0859   HCT 41.9 05/30/2012 0859   PLT 274 05/30/2012 0859   MCV 86.0 05/30/2012 0859   MCH 28.7 05/30/2012 0859   MCHC 33.4 05/30/2012 0859   RDW 14.3 05/30/2012 0859    Hgb A1C No results found for: HGBA1C          Assessment & Plan:   Preventative Health Maintenance:  She declines flu shot today Tetanus UTD She will call her GYN to set up mammogram and pap smear Encouraged her to consume a balanced diet and exercise program Encouraged her to see an eye doctor and dentist annually Will check CBC, CMET, and Lipid Profile today  Hair loss:  Will check TSH today  Bilateral cerumen impaction:  We were going to flush her ears but she left before having this done Called and advised her to try Debrox OTC to see if this helps  RTC in 1 year, sooner if needed Nicki ReaperBAITY, Trusten Hume, NP

## 2015-09-20 NOTE — Patient Instructions (Signed)
Health Maintenance, Female Adopting a healthy lifestyle and getting preventive care can go a long way to promote health and wellness. Talk with your health care provider about what schedule of regular examinations is right for you. This is a good chance for you to check in with your provider about disease prevention and staying healthy. In between checkups, there are plenty of things you can do on your own. Experts have done a lot of research about which lifestyle changes and preventive measures are most likely to keep you healthy. Ask your health care provider for more information. WEIGHT AND DIET  Eat a healthy diet  Be sure to include plenty of vegetables, fruits, low-fat dairy products, and lean protein.  Do not eat a lot of foods high in solid fats, added sugars, or salt.  Get regular exercise. This is one of the most important things you can do for your health.  Most adults should exercise for at least 150 minutes each week. The exercise should increase your heart rate and make you sweat (moderate-intensity exercise).  Most adults should also do strengthening exercises at least twice a week. This is in addition to the moderate-intensity exercise.  Maintain a healthy weight  Body mass index (BMI) is a measurement that can be used to identify possible weight problems. It estimates body fat based on height and weight. Your health care provider can help determine your BMI and help you achieve or maintain a healthy weight.  For females 20 years of age and older:   A BMI below 18.5 is considered underweight.  A BMI of 18.5 to 24.9 is normal.  A BMI of 25 to 29.9 is considered overweight.  A BMI of 30 and above is considered obese.  Watch levels of cholesterol and blood lipids  You should start having your blood tested for lipids and cholesterol at 43 years of age, then have this test every 5 years.  You may need to have your cholesterol levels checked more often if:  Your lipid  or cholesterol levels are high.  You are older than 43 years of age.  You are at high risk for heart disease.  CANCER SCREENING   Lung Cancer  Lung cancer screening is recommended for adults 55-80 years old who are at high risk for lung cancer because of a history of smoking.  A yearly low-dose CT scan of the lungs is recommended for people who:  Currently smoke.  Have quit within the past 15 years.  Have at least a 30-pack-year history of smoking. A pack year is smoking an average of one pack of cigarettes a day for 1 year.  Yearly screening should continue until it has been 15 years since you quit.  Yearly screening should stop if you develop a health problem that would prevent you from having lung cancer treatment.  Breast Cancer  Practice breast self-awareness. This means understanding how your breasts normally appear and feel.  It also means doing regular breast self-exams. Let your health care provider know about any changes, no matter how small.  If you are in your 20s or 30s, you should have a clinical breast exam (CBE) by a health care provider every 1-3 years as part of a regular health exam.  If you are 40 or older, have a CBE every year. Also consider having a breast X-ray (mammogram) every year.  If you have a family history of breast cancer, talk to your health care provider about genetic screening.  If you   are at high risk for breast cancer, talk to your health care provider about having an MRI and a mammogram every year.  Breast cancer gene (BRCA) assessment is recommended for women who have family members with BRCA-related cancers. BRCA-related cancers include:  Breast.  Ovarian.  Tubal.  Peritoneal cancers.  Results of the assessment will determine the need for genetic counseling and BRCA1 and BRCA2 testing. Cervical Cancer Your health care provider may recommend that you be screened regularly for cancer of the pelvic organs (ovaries, uterus, and  vagina). This screening involves a pelvic examination, including checking for microscopic changes to the surface of your cervix (Pap test). You may be encouraged to have this screening done every 3 years, beginning at age 21.  For women ages 30-65, health care providers may recommend pelvic exams and Pap testing every 3 years, or they may recommend the Pap and pelvic exam, combined with testing for human papilloma virus (HPV), every 5 years. Some types of HPV increase your risk of cervical cancer. Testing for HPV may also be done on women of any age with unclear Pap test results.  Other health care providers may not recommend any screening for nonpregnant women who are considered low risk for pelvic cancer and who do not have symptoms. Ask your health care provider if a screening pelvic exam is right for you.  If you have had past treatment for cervical cancer or a condition that could lead to cancer, you need Pap tests and screening for cancer for at least 20 years after your treatment. If Pap tests have been discontinued, your risk factors (such as having a new sexual partner) need to be reassessed to determine if screening should resume. Some women have medical problems that increase the chance of getting cervical cancer. In these cases, your health care provider may recommend more frequent screening and Pap tests. Colorectal Cancer  This type of cancer can be detected and often prevented.  Routine colorectal cancer screening usually begins at 43 years of age and continues through 43 years of age.  Your health care provider may recommend screening at an earlier age if you have risk factors for colon cancer.  Your health care provider may also recommend using home test kits to check for hidden blood in the stool.  A small camera at the end of a tube can be used to examine your colon directly (sigmoidoscopy or colonoscopy). This is done to check for the earliest forms of colorectal  cancer.  Routine screening usually begins at age 50.  Direct examination of the colon should be repeated every 5-10 years through 43 years of age. However, you may need to be screened more often if early forms of precancerous polyps or small growths are found. Skin Cancer  Check your skin from head to toe regularly.  Tell your health care provider about any new moles or changes in moles, especially if there is a change in a mole's shape or color.  Also tell your health care provider if you have a mole that is larger than the size of a pencil eraser.  Always use sunscreen. Apply sunscreen liberally and repeatedly throughout the day.  Protect yourself by wearing long sleeves, pants, a wide-brimmed hat, and sunglasses whenever you are outside. HEART DISEASE, DIABETES, AND HIGH BLOOD PRESSURE   High blood pressure causes heart disease and increases the risk of stroke. High blood pressure is more likely to develop in:  People who have blood pressure in the high end   of the normal range (130-139/85-89 mm Hg).  People who are overweight or obese.  People who are African American.  If you are 38-23 years of age, have your blood pressure checked every 3-5 years. If you are 61 years of age or older, have your blood pressure checked every year. You should have your blood pressure measured twice--once when you are at a hospital or clinic, and once when you are not at a hospital or clinic. Record the average of the two measurements. To check your blood pressure when you are not at a hospital or clinic, you can use:  An automated blood pressure machine at a pharmacy.  A home blood pressure monitor.  If you are between 45 years and 39 years old, ask your health care provider if you should take aspirin to prevent strokes.  Have regular diabetes screenings. This involves taking a blood sample to check your fasting blood sugar level.  If you are at a normal weight and have a low risk for diabetes,  have this test once every three years after 43 years of age.  If you are overweight and have a high risk for diabetes, consider being tested at a younger age or more often. PREVENTING INFECTION  Hepatitis B  If you have a higher risk for hepatitis B, you should be screened for this virus. You are considered at high risk for hepatitis B if:  You were born in a country where hepatitis B is common. Ask your health care provider which countries are considered high risk.  Your parents were born in a high-risk country, and you have not been immunized against hepatitis B (hepatitis B vaccine).  You have HIV or AIDS.  You use needles to inject street drugs.  You live with someone who has hepatitis B.  You have had sex with someone who has hepatitis B.  You get hemodialysis treatment.  You take certain medicines for conditions, including cancer, organ transplantation, and autoimmune conditions. Hepatitis C  Blood testing is recommended for:  Everyone born from [birth year] through 1963.  Anyone with known risk factors for hepatitis C. Sexually transmitted infections (STIs)  You should be screened for sexually transmitted infections (STIs) including gonorrhea and chlamydia if:  You are sexually active and are younger than 43 years of age.  You are older than 43 years of age and your health care provider tells you that you are at risk for this type of infection.  Your sexual activity has changed since you were last screened and you are at an increased risk for chlamydia or gonorrhea. Ask your health care provider if you are at risk.  If you do not have HIV, but are at risk, it may be recommended that you take a prescription medicine daily to prevent HIV infection. This is called pre-exposure prophylaxis (PrEP). You are considered at risk if:  You are sexually active and do not regularly use condoms or know the HIV status of your partner(s).  You take drugs by injection.  You are sexually  active with a partner who has HIV. Talk with your health care provider about whether you are at high risk of being infected with HIV. If you choose to begin PrEP, you should first be tested for HIV. You should then be tested every 3 months for as long as you are taking PrEP.  PREGNANCY   If you are premenopausal and you may become pregnant, ask your health care provider about preconception counseling.  If you may  become pregnant, take 400 to 800 micrograms (mcg) of folic acid every day.  If you want to prevent pregnancy, talk to your health care provider about birth control (contraception). OSTEOPOROSIS AND MENOPAUSE   Osteoporosis is a disease in which the bones lose minerals and strength with aging. This can result in serious bone fractures. Your risk for osteoporosis can be identified using a bone density scan.  If you are 61 years of age or older, or if you are at risk for osteoporosis and fractures, ask your health care provider if you should be screened.  Ask your health care provider whether you should take a calcium or vitamin D supplement to lower your risk for osteoporosis.  Menopause may have certain physical symptoms and risks.  Hormone replacement therapy may reduce some of these symptoms and risks. Talk to your health care provider about whether hormone replacement therapy is right for you.  HOME CARE INSTRUCTIONS   Schedule regular health, dental, and eye exams.  Stay current with your immunizations.   Do not use any tobacco products including cigarettes, chewing tobacco, or electronic cigarettes.  If you are pregnant, do not drink alcohol.  If you are breastfeeding, limit how much and how often you drink alcohol.  Limit alcohol intake to no more than 1 drink per day for nonpregnant women. One drink equals 12 ounces of beer, 5 ounces of wine, or 1 ounces of hard liquor.  Do not use street drugs.  Do not share needles.  Ask your health care provider for help if  you need support or information about quitting drugs.  Tell your health care provider if you often feel depressed.  Tell your health care provider if you have ever been abused or do not feel safe at home.   This information is not intended to replace advice given to you by your health care provider. Make sure you discuss any questions you have with your health care provider.   Document Released: 07/11/2010 Document Revised: 01/16/2014 Document Reviewed: 11/27/2012 Elsevier Interactive Patient Education [employer].

## 2015-10-21 ENCOUNTER — Encounter: Payer: Self-pay | Admitting: Internal Medicine

## 2015-10-21 ENCOUNTER — Ambulatory Visit (INDEPENDENT_AMBULATORY_CARE_PROVIDER_SITE_OTHER): Payer: BLUE CROSS/BLUE SHIELD | Admitting: Internal Medicine

## 2015-10-21 VITALS — BP 114/78 | HR 66 | Temp 99.5°F | Wt 155.5 lb

## 2015-10-21 DIAGNOSIS — H6123 Impacted cerumen, bilateral: Secondary | ICD-10-CM

## 2015-10-21 NOTE — Progress Notes (Signed)
Subjective:    Patient ID: Breanna Tran, female    DOB: June 21, 1970, 45 y.o.   MRN: [id number]  HPI  Pt presents to the clinic today to have her ear flushed out. It was noted on her physical exam 09/20/15 that she had bilateral cerumen impaction. This was supposed to be flushed out at that visit, but she ended up leaving without having it done. She does have some decreased hearing and ear fullness.  Review of Systems      Past Medical History:  Diagnosis Date  . Genital warts due to HPV (human papillomavirus) 2000   wart removed.     Current Outpatient Prescriptions  Medication Sig Dispense Refill  . Multiple Vitamin (MULTIVITAMIN) tablet Take 1 tablet by mouth daily.    Marland Kitchen. OVER THE COUNTER MEDICATION Diatomaceous Earth 1 tablespoon daily    . PSYLLIUM HUSK PO Take 5 capsules by mouth daily.     No current facility-administered medications for this visit.     No Known Allergies  Family History  Problem Relation Age of Onset  . Cancer Father     renal  . Cancer Maternal Grandfather     lung  . Graves' disease Mother   . Brain cancer Maternal Uncle     Social History   Social History  . Marital status: Married    Spouse name: N/A  . Number of children: N/A  . Years of education: N/A   Occupational History  . Not on file.   Social History Main Topics  . Smoking status: Former Smoker    Packs/day: 0.50    Years: 15.00    Quit date: 01/10/2012  . Smokeless tobacco: Never Used  . Alcohol use 0.6 oz/week    1 Glasses of wine per week     Comment: rare  . Drug use: No  . Sexual activity: Yes    Birth control/ protection: Condom   Other Topics Concern  . Not on file   Social History Narrative  . No narrative on file     Constitutional: Denies fever, malaise, fatigue, headache or abrupt weight changes.  HEENT: Pt reports decreased hearing and wax buildup in bilateral ears. Denies eye pain, eye redness, ear pain, ringing in the ears, runny nose, nasal  congestion, bloody nose, or sore throat.   No other specific complaints in a complete review of systems (except as listed in HPI above).  Objective:   Physical Exam   BP 114/78   Pulse 66   Temp 99.5 F (37.5 C) (Oral)   Wt 155 lb 8 oz (70.5 kg)   SpO2 98%   BMI 25.48 kg/m  Wt Readings from Last 3 Encounters:  10/21/15 155 lb 8 oz (70.5 kg)  09/20/15 153 lb 8 oz (69.6 kg)  08/31/15 154 lb 12 oz (70.2 kg)    General: Appears her stated age, well developed, well nourished in NAD. HEENT: Ears: Bilateral cerumen impaction.   BMET    Component Value Date/Time   NA 140 09/20/2015 1035   K 4.0 09/20/2015 1035   CL 105 09/20/2015 1035   CO2 30 09/20/2015 1035   GLUCOSE 87 09/20/2015 1035   BUN 7 09/20/2015 1035   CREATININE 0.68 09/20/2015 1035   CREATININE 0.81 05/30/2012 0859   CALCIUM 9.1 09/20/2015 1035    Lipid Panel     Component Value Date/Time   CHOL 158 09/20/2015 1035   TRIG 111.0 09/20/2015 1035   HDL 43.40 09/20/2015 1035  CHOLHDL 4 09/20/2015 1035   VLDL 22.2 09/20/2015 1035   LDLCALC 93 09/20/2015 1035    CBC    Component Value Date/Time   WBC 6.6 09/20/2015 1035   RBC 4.53 09/20/2015 1035   HGB 13.6 09/20/2015 1035   HCT 40.1 09/20/2015 1035   PLT 224.0 09/20/2015 1035   MCV 88.6 09/20/2015 1035   MCH 28.7 05/30/2012 0859   MCHC 33.9 09/20/2015 1035   RDW 12.1 09/20/2015 1035    Hgb A1C No results found for: HGBA1C         Assessment & Plan:   Hearing loss secondary to bilateral cerumen impaction:  Manual lavage by CMA Advised her to use Debrox OTC 2 x week to avoid wax accumulation  RTC in 1 year for annual exam Nicki Reaper, NP

## 2015-10-21 NOTE — Patient Instructions (Signed)
Cerumen Impaction The structures of the external ear canal secrete a waxy substance known as cerumen. Excess cerumen can build up in the ear canal, causing a condition known as cerumen impaction. Cerumen impaction can cause ear pain and disrupt the function of the ear. The rate of cerumen production differs for each individual. In certain individuals, the configuration of the ear canal may decrease his or her ability to naturally remove cerumen. CAUSES Cerumen impaction is caused by excessive cerumen production or buildup. RISK FACTORS  Frequent use of swabs to clean ears.  Having narrow ear canals.  Having eczema.  Being dehydrated. SIGNS AND SYMPTOMS  Diminished hearing.  Ear drainage.  Ear pain.  Ear itch. TREATMENT Treatment may involve:  Over-the-counter or prescription ear drops to soften the cerumen.  Removal of cerumen by a health care provider. This may be done with:  Irrigation with warm water. This is the most common method of removal.  Ear curettes and other instruments.  Surgery. This may be done in severe cases. HOME CARE INSTRUCTIONS  Take medicines only as directed by your health care provider.  Do not insert objects into the ear with the intent of cleaning the ear. PREVENTION  Do not insert objects into the ear, even with the intent of cleaning the ear. Removing cerumen as a part of normal hygiene is not necessary, and the use of swabs in the ear canal is not recommended.  Drink enough water to keep your urine clear or pale yellow.  Control your eczema if you have it. SEEK MEDICAL CARE IF:  You develop ear pain.  You develop bleeding from the ear.  The cerumen does not clear after you use ear drops as directed.   This information is not intended to replace advice given to you by your health care provider. Make sure you discuss any questions you have with your health care provider.   Document Released: 02/03/2004 Document Revised: 01/16/2014  Document Reviewed: 08/12/2014 Elsevier Interactive Patient Education 2016 Elsevier Inc.  

## 2016-01-08 DIAGNOSIS — J029 Acute pharyngitis, unspecified: Secondary | ICD-10-CM | POA: Diagnosis not present

## 2016-01-28 ENCOUNTER — Telehealth: Payer: Self-pay

## 2016-01-28 NOTE — Telephone Encounter (Signed)
PLEASE NOTE: All timestamps contained within this report are represented as [place] Standard Time. CONFIDENTIALTY NOTICE: This fax transmission is intended only for the addressee. It contains information that is legally privileged, confidential or otherwise protected from use or disclosure. If you are not the intended recipient, you are strictly prohibited from reviewing, disclosing, copying using or disseminating any of this information or taking any action in reliance on or regarding this information. If you have received this fax in error, please notify us immediately by telephone so that we can arrange for its return to [place]. Phone: [phone], Toll-Free: [phone], Fax: [phone] Page: 1 of 2 Call Id: [id number] [facility] Night - Client TELEPHONE ADVICE RECORD [facility] Patient Name: Breanna Tran Gender: Female DOB: [birth year] Age: 46 Y 9 M 29 D Return Phone Number: [phone] (Primary) Address: City/State/Zip: [place] Client [facility] Night - Client Client Site [facility] [place] - Night Contact Type Call Who Is Calling Patient / Member / Family / Caregiver Call Type Triage / Clinical Relationship To Patient Self Return Phone Number [phone] (Primary) Chief Complaint Rectal Symptoms Reason for Call Symptomatic / Request for Health Information Initial Comment Caller states she has anal fissures. PreDisposition Call Doctor Translation No Nurse Assessment Nurse: Brooke Dare, RN, [facility] Date/Time Lamount Cohen Time): 01/27/2016 12:41:50 PM Confirm and document reason for call. If symptomatic, describe symptoms. ---Caller states she is having sharp pain in her rectal area with bowel movement. Small amount of blood when she wipes. Been going on for about 5 days. Been doing sitz bath. No fever. No other symptoms. Does the patient have any new or worsening symptoms? ---Yes Will a triage be completed?  ---Yes Related visit to physician within the last 2 weeks? ---No Does the PT have any chronic conditions? (i.e. diabetes, asthma, etc.) ---No Is the patient pregnant or possibly pregnant? (Ask all females between the ages of 80-55) ---No Is this a behavioral health or substance abuse call? ---No Guidelines Guideline Title Affirmed Question Affirmed Notes Nurse Date/Time (Eastern Time) Rectal Symptoms [1] Home treatment > 3 days for rectal pain AND [2] not improved Lovenia Kim 01/27/2016 12:43:35 PM Disp. Time Lamount Cohen Time) Disposition Final User 01/27/2016 12:49:45 PM See PCP When Office is Open (within 3 days) Yes Brooke Dare, RN, Anders Simmonds Understands: Yes PLEASE NOTE: All timestamps contained within this report are represented as [place] Standard Time. CONFIDENTIALTY NOTICE: This fax transmission is intended only for the addressee. It contains information that is legally privileged, confidential or otherwise protected from use or disclosure. If you are not the intended recipient, you are strictly prohibited from reviewing, disclosing, copying using or disseminating any of this information or taking any action in reliance on or regarding this information. If you have received this fax in error, please notify us immediately by telephone so that we can arrange for its return to [place]. Phone: [phone], Toll-Free: [phone], Fax: [phone] Page: 2 of 2 Call Id: [id number] Disagree/Comply: Comply Care Advice Given Per Guideline * You need to be seen within 2 or 3 days. Call your doctor during regular office hours and make an appointment. An urgent care center is often the best source of care if your doctor's office is closed or you can't get an appointment. NOTE: If office will be open tomorrow, tell caller to call then, not in 3 days. SEE PCP WITHIN 3 DAYS: WARM SALINE SITZ BATHS TWICE DAILY FOR RECTAL SYMPTOMS: * Sit in a warm sitz  bath for 20 minutes twice a day. This will  decrease swelling and irritation, keep the area clean, and help with healing. * After SITZ bath and drying your rectal area, apply 1% hydrocortisone ointment (OTC) 2 times a day to reduce irritation. * [place] - Found in various OTC hemorrhoid medications (e.g., Anusol HC, Preparation H Hydrocortisone, Analpram HC Cream) PREVENTING CONSTIPATION: * Eat a high fiber diet. * Drink adequate liquids. * Exercise regularly (even a daily 15 minute walk!) * Get into a rhythm - try to have a BM at the same time each day. * Don't ignore your body's signals regarding having a BM. * Severe pain CALL BACK IF: * Fever over 100.5 F (38.1 C) * You become worse. CARE ADVICE given per Rectal Symptoms (Adult) guideline. Comments User: [name], King, RN Date/Time Lamount Cohen(Eastern Time): 01/27/2016 12:44:32 PM diagnosed with strep throat and was on antibiotics, which gave her diarrhea and irritated her hemrrhoids Referrals REFERRED TO PCP OFFICE

## 2016-01-28 NOTE — Telephone Encounter (Signed)
I called pt and sitz bath is helping and if not cleared up by next week pt will cb and schedule appt. FYI to Pamala Hurry Baity NP.

## 2016-01-28 NOTE — Telephone Encounter (Signed)
She can also try Tucks pads or preporation H OTC in the meantime

## 2016-02-01 ENCOUNTER — Encounter: Payer: Self-pay | Admitting: Internal Medicine

## 2016-02-01 ENCOUNTER — Ambulatory Visit (INDEPENDENT_AMBULATORY_CARE_PROVIDER_SITE_OTHER): Payer: BLUE CROSS/BLUE SHIELD | Admitting: Internal Medicine

## 2016-02-01 VITALS — BP 114/76 | HR 95 | Temp 98.8°F | Wt 156.8 lb

## 2016-02-01 DIAGNOSIS — K921 Melena: Secondary | ICD-10-CM | POA: Diagnosis not present

## 2016-02-01 DIAGNOSIS — K6289 Other specified diseases of anus and rectum: Secondary | ICD-10-CM | POA: Diagnosis not present

## 2016-02-01 MED ORDER — HYDROCORTISONE ACETATE 25 MG RE SUPP: 25.0000 mg | Freq: Two times a day (BID) | RECTAL | 0 refills | Status: DC

## 2016-02-01 NOTE — Patient Instructions (Signed)
   Proctalgia Fugax Introduction Proctalgia fugax is a condition that involves very short episodes of intense pain in the rectum. The rectum is the last part of the large intestine. The pain can last from seconds to minutes. Episodes often occur during the night and awaken the person from sleep. This condition is not a sign of cancer, but your health care provider may want to rule out a number of other conditions. What are the causes? The cause of this condition is not known. One possible cause may be spasm of the pelvic muscles or of the lowest part of the large intestine. What are the signs or symptoms? The only symptom of this condition is rectal pain.  The pain may be intense or severe.  The pain may last for only a few seconds or it may last up to 30 minutes.  The pain may occur at night and wake you up from sleep. How is this diagnosed? This condition may be diagnosed by ruling out other problems that could cause the pain. Diagnosis may include:  Medical history and physical exam.  Various tests, such as:  Anoscopy. In this test, a lighted scope is put into the rectum to look for abnormalities.  Barium enema. In this test, X-rays are taken after a white chalky substance called barium is put into the colon. The barium makes it easier to see problems because it shows up well on the X-rays.  Blood tests to rule out infections or other problems. How is this treated? There is no specific treatment to cure this condition. Treatment options may include:  Medicines.  Warm baths.  Relaxation techniques.  Gentle massage of the painful area.  Biofeedback. Follow these instructions at home:  Take over-the-counter and prescription medicines only as told by your health care provider.  Follow instructions from your health care provider about diet.  Follow instructions from your health care provider about rest and physical activity.  Try warm baths, massaging the area, or  progressive relaxation techniques as told by your health care provider.  Keep all follow-up visits as told by your health care provider. This is important. Contact a health care provider if:  You develop new symptoms.  Your pain does not get better as soon as it usually does. This information is not intended to replace advice given to you by your health care provider. Make sure you discuss any questions you have with your health care provider. Document Released: 09/20/2000 Document Revised: 06/03/2015 Document Reviewed: 03/23/2014  2017 Elsevier  

## 2016-02-01 NOTE — Progress Notes (Signed)
Subjective:    Patient ID: Breanna Tran, female    DOB: [birth year], 46 y.o.   MRN: [id number]  HPI  Pt presents to the clinic today with c/o pain with bowel movements. She reports this started 1 week ago. It has been intermittent during that time. She has felt like her stools were hard, so she has been taking a stool softener OTC with good relief. She had been doing okay until this morning, when she reports she had extreme pain with a BM and noticed bright red blood in the toilet. She denies abdominal pain, nausea, vomiting or diarrhea. She reports burning around her rectum. She has tried Herbalistitz bath, Colace and Preparation H with some relief. She thinks she may have a hemorrhoid but is not sure.  Review of Systems  Past Medical History:  Diagnosis Date  . Genital warts due to HPV (human papillomavirus) 2000   wart removed.     Current Outpatient Prescriptions  Medication Sig Dispense Refill  . Multiple Vitamin (MULTIVITAMIN) tablet Take 1 tablet by mouth daily.    Marland Kitchen. OVER THE COUNTER MEDICATION Diatomaceous Earth 1 tablespoon daily    . PSYLLIUM HUSK PO Take 5 capsules by mouth daily.     No current facility-administered medications for this visit.     Allergies  Allergen Reactions  . Augmentin [Amoxicillin-Pot Clavulanate] Diarrhea    Family History  Problem Relation Age of Onset  . Cancer Father     renal  . Cancer Maternal Grandfather     lung  . Graves' disease Mother   . Brain cancer Maternal Uncle     Social History   Social History  . Marital status: Married    Spouse name: N/A  . Number of children: N/A  . Years of education: N/A   Occupational History  . Not on file.   Social History Main Topics  . Smoking status: Former Smoker    Packs/day: 0.50    Years: 15.00    Quit date: 01/10/2012  . Smokeless tobacco: Never Used  . Alcohol use 0.6 oz/week    1 Glasses of wine per week     Comment: rare  . Drug use: No  . Sexual activity: Yes    Birth  control/ protection: Condom   Other Topics Concern  . Not on file   Social History Narrative  . No narrative on file     Constitutional: Denies fever, malaise, fatigue, headache or abrupt weight changes.  Gastrointestinal: Pt reports rectal pain and blood in stool. Denies abdominal pain, bloating, diarrhea.   No other specific complaints in a complete review of systems (except as listed in HPI above).     Objective:   Physical Exam  BP 114/76   Pulse 95   Temp 98.8 F (37.1 C) (Oral)   Wt 156 lb 12 oz (71.1 kg)   SpO2 99%   BMI 25.69 kg/m  Wt Readings from Last 3 Encounters:  02/01/16 156 lb 12 oz (71.1 kg)  10/21/15 155 lb 8 oz (70.5 kg)  09/20/15 153 lb 8 oz (69.6 kg)    General: Appears her stated age, well developed, well nourished in NAD. Abdomen: Soft and nontender. Normal bowel sounds.  Rectal: No external mass noted. Normal rectal tone. No internal mass or fissure noted.  BMET    Component Value Date/Time   NA 140 09/20/2015 1035   K 4.0 09/20/2015 1035   CL 105 09/20/2015 1035   CO2 30 09/20/2015 1035  GLUCOSE 87 09/20/2015 1035   BUN 7 09/20/2015 1035   CREATININE 0.68 09/20/2015 1035   CREATININE 0.81 05/30/2012 0859   CALCIUM 9.1 09/20/2015 1035    Lipid Panel     Component Value Date/Time   CHOL 158 09/20/2015 1035   TRIG 111.0 09/20/2015 1035   HDL 43.40 09/20/2015 1035   CHOLHDL 4 09/20/2015 1035   VLDL 22.2 09/20/2015 1035   LDLCALC 93 09/20/2015 1035    CBC    Component Value Date/Time   WBC 6.6 09/20/2015 1035   RBC 4.53 09/20/2015 1035   HGB 13.6 09/20/2015 1035   HCT 40.1 09/20/2015 1035   PLT 224.0 09/20/2015 1035   MCV 88.6 09/20/2015 1035   MCH 28.7 05/30/2012 0859   MCHC 33.9 09/20/2015 1035   RDW 12.1 09/20/2015 1035    Hgb A1C No results found for: HGBA1C       Assessment & Plan:   Rectal pain, blood in stool:  Hemoccult negative What she describes sounds like internal hemorrhoid vs fissure but exam  benign eRx for Anusol HC suppositories If increased blood noted, can give Nitroglycerin Cream to affected area, but held off for now because no fissure noted Continue to drink plenty of water Continue Colace if needed If worse, will refer to GI for sigmoidoscopy  RTC as needed or if symptoms persist or worsen BAITY, REGINA, NP

## 2016-03-06 ENCOUNTER — Telehealth: Payer: Self-pay | Admitting: Internal Medicine

## 2016-03-06 ENCOUNTER — Other Ambulatory Visit: Payer: Self-pay | Admitting: Internal Medicine

## 2016-03-06 DIAGNOSIS — K6289 Other specified diseases of anus and rectum: Secondary | ICD-10-CM

## 2016-03-06 DIAGNOSIS — K921 Melena: Secondary | ICD-10-CM

## 2016-03-06 NOTE — Telephone Encounter (Signed)
Referral placed.

## 2016-03-06 NOTE — Telephone Encounter (Signed)
Pt called requesting referral to GI doctor. Never seen GI before. Pt prefers [facility]. Please advise.

## 2016-03-14 DIAGNOSIS — K640 First degree hemorrhoids: Secondary | ICD-10-CM | POA: Diagnosis not present

## 2016-04-21 DIAGNOSIS — K640 First degree hemorrhoids: Secondary | ICD-10-CM | POA: Diagnosis not present

## 2016-05-02 ENCOUNTER — Ambulatory Visit: Payer: BLUE CROSS/BLUE SHIELD | Admitting: Internal Medicine

## 2016-06-13 DIAGNOSIS — K640 First degree hemorrhoids: Secondary | ICD-10-CM | POA: Diagnosis not present

## 2016-07-21 DIAGNOSIS — K601 Chronic anal fissure: Secondary | ICD-10-CM | POA: Diagnosis not present

## 2016-08-16 DIAGNOSIS — K601 Chronic anal fissure: Secondary | ICD-10-CM | POA: Diagnosis not present

## 2016-08-18 ENCOUNTER — Emergency Department (HOSPITAL_COMMUNITY)
Admission: EM | Admit: 2016-08-18 | Discharge: 2016-08-18 | Disposition: A | Discharge status: Home / Self Care | Payer: BLUE CROSS/BLUE SHIELD | Attending: Emergency Medicine | Admitting: Emergency Medicine

## 2016-08-18 ENCOUNTER — Encounter (HOSPITAL_COMMUNITY): Payer: Self-pay | Admitting: *Deleted

## 2016-08-18 DIAGNOSIS — R197 Diarrhea, unspecified: Secondary | ICD-10-CM | POA: Diagnosis not present

## 2016-08-18 DIAGNOSIS — Z5321 Procedure and treatment not carried out due to patient leaving prior to being seen by health care provider: Secondary | ICD-10-CM | POA: Insufficient documentation

## 2016-08-18 DIAGNOSIS — R404 Transient alteration of awareness: Secondary | ICD-10-CM | POA: Diagnosis not present

## 2016-08-18 DIAGNOSIS — R42 Dizziness and giddiness: Secondary | ICD-10-CM | POA: Diagnosis not present

## 2016-08-18 NOTE — ED Triage Notes (Addendum)
Pt in from home, c/o x 6 diarrhea episodes since 9am today, c/o near syncope episode, denies CP & SOB, pt c/o nausea, denies vomiting, A&O x4, pt reports being treated for an anal fissure d/t diarrhea after antibiotic use

## 2016-08-18 NOTE — ED Notes (Signed)
Pt states she feels better and is leaving. Informed to come back if needed.

## 2016-09-19 ENCOUNTER — Telehealth: Payer: Self-pay | Admitting: Internal Medicine

## 2016-09-19 NOTE — Telephone Encounter (Signed)
Opened in error

## 2016-10-13 ENCOUNTER — Ambulatory Visit (INDEPENDENT_AMBULATORY_CARE_PROVIDER_SITE_OTHER): Payer: BLUE CROSS/BLUE SHIELD | Admitting: Family Medicine

## 2016-10-13 ENCOUNTER — Encounter: Payer: Self-pay | Admitting: Family Medicine

## 2016-10-13 VITALS — BP 106/68 | HR 86 | Temp 98.7°F | Ht 65.5 in | Wt 129.4 lb

## 2016-10-13 DIAGNOSIS — K602 Anal fissure, unspecified: Secondary | ICD-10-CM

## 2016-10-13 DIAGNOSIS — J029 Acute pharyngitis, unspecified: Secondary | ICD-10-CM | POA: Diagnosis not present

## 2016-10-13 LAB — POCT RAPID STREP A (OFFICE): Rapid Strep A Screen: POSITIVE — AB

## 2016-10-13 MED ORDER — AMOXICILLIN 500 MG PO TABS
500.0000 mg | ORAL_TABLET | Freq: Two times a day (BID) | ORAL | 0 refills | Status: DC
Start: 2016-10-13 — End: 2016-12-15

## 2016-10-13 NOTE — Patient Instructions (Signed)
After 3 doses of antibiotic please wash/replace toothbrush, change sheets and towels   Strep Throat Strep throat is a bacterial infection of the throat. Your health care provider may call the infection tonsillitis or pharyngitis, depending on whether there is swelling in the tonsils or at the back of the throat. Strep throat is most common during the cold months of the year in children who are 44-44 years of age, but it can happen during any season in people of any age. This infection is spread from person to person (contagious) through coughing, sneezing, or close contact. What are the causes? Strep throat is caused by the bacteria called Streptococcus pyogenes. What increases the risk? This condition is more likely to develop in:  People who spend time in crowded places where the infection can spread easily.  People who have close contact with someone who has strep throat.  What are the signs or symptoms? Symptoms of this condition include:  Fever or chills.  Redness, swelling, or pain in the tonsils or throat.  Pain or difficulty when swallowing.  White or yellow spots on the tonsils or throat.  Swollen, tender glands in the neck or under the jaw.  Red rash all over the body (rare).  How is this diagnosed? This condition is diagnosed by performing a rapid strep test or by taking a swab of your throat (throat culture test). Results from a rapid strep test are usually ready in a few minutes, but throat culture test results are available after one or two days. How is this treated? This condition is treated with antibiotic medicine. Follow these instructions at home: Medicines  Take over-the-counter and prescription medicines only as told by your health care provider.  Take your antibiotic as told by your health care provider. Do not stop taking the antibiotic even if you start to feel better.  Have family members who also have a sore throat or fever tested for strep throat.  They may need antibiotics if they have the strep infection. Eating and drinking  Do not share food, drinking cups, or personal items that could cause the infection to spread to other people.  If swallowing is difficult, try eating soft foods until your sore throat feels better.  Drink enough fluid to keep your urine clear or pale yellow. General instructions  Gargle with a salt-water mixture 3-4 times per day or as needed. To make a salt-water mixture, completely dissolve -1 tsp of salt in 1 cup of warm water.  Make sure that all household members wash their hands well.  Get plenty of rest.  Stay home from school or work until you have been taking antibiotics for 24 hours.  Keep all follow-up visits as told by your health care provider. This is important. Contact a health care provider if:  The glands in your neck continue to get bigger.  You develop a rash, cough, or earache.  You cough up a thick liquid that is green, yellow-brown, or bloody.  You have pain or discomfort that does not get better with medicine.  Your problems seem to be getting worse rather than better.  You have a fever. Get help right away if:  You have new symptoms, such as vomiting, severe headache, stiff or painful neck, chest pain, or shortness of breath.  You have severe throat pain, drooling, or changes in your voice.  You have swelling of the neck, or the skin on the neck becomes red and tender.  You have signs of dehydration,  such as fatigue, dry mouth, and decreased urination.  You become increasingly sleepy, or you cannot wake up completely.  Your joints become red or painful. This information is not intended to replace advice given to you by your health care provider. Make sure you discuss any questions you have with your health care provider. Document Released: 12/24/1999 Document Revised: 08/25/2015 Document Reviewed: 04/20/2014 Elsevier Interactive Patient Education  2017 [employer].

## 2016-10-13 NOTE — Progress Notes (Signed)
Subjective:    Patient ID: Breanna Tran, female    DOB: 1970-01-22, 46 y.o.   MRN: [id number]  HPI This is a 46 yo female who presents today with sore throat x 3 days. Also having bilateral ear pain and headache. No cough. Fever to 101. No nausea or vomiting. No SOB, no wheeze. Has taken ibuprofen 800 mg every 6-8 hours and dayquil without much relief. Kids with viral illness, not strep. Good fluid intake. Decreased appetite.  Weight loss- has had anal fissure and has radically changed her diet to vegetarian. Has lost about 25 pounds, has had great deal of stress and pain related to this and has been seeing someone in [place]. She is interested in seeing someone locally.   Past Medical History:  Diagnosis Date  . Genital warts due to HPV (human papillomavirus) 2000   wart removed.    Past Surgical History:  Procedure Laterality Date  . APPENDECTOMY  1993  . WISDOM TOOTH EXTRACTION  1995   Family History  Problem Relation Age of Onset  . Cancer Father        renal  . Cancer Maternal Grandfather        lung  . Graves' disease Mother   . Brain cancer Maternal Uncle    Social History  Substance Use Topics  . Smoking status: Former Smoker    Packs/day: 0.50    Years: 15.00    Quit date: 01/10/2012  . Smokeless tobacco: Never Used  . Alcohol use 0.6 oz/week    1 Glasses of wine per week     Comment: rare      Review of Systems Per HPI    Objective:   Physical Exam  Constitutional: She is oriented to person, place, and time. She appears well-developed and well-nourished. No distress.  HENT:  Head: Normocephalic and atraumatic.  Right Ear: Tympanic membrane and ear canal normal.  Left Ear: Tympanic membrane and ear canal normal.  Nose: Nose normal.  Mouth/Throat: Uvula is midline and mucous membranes are normal. Oropharyngeal exudate and posterior oropharyngeal erythema present. No tonsillar abscesses. Posterior oropharyngeal edema: 1+ tonsils.  Moderate amount light  yellow cerumen bilaterally.   Eyes: Conjunctivae are normal.  Cardiovascular: Normal rate, regular rhythm and normal heart sounds.   Pulmonary/Chest: Effort normal and breath sounds normal.  Neurological: She is alert and oriented to person, place, and time.  Skin: Skin is warm and dry. She is not diaphoretic.  Psychiatric: She has a normal mood and affect. Her behavior is normal. Judgment and thought content normal.  Vitals reviewed.     BP 106/68   Pulse 86   Temp 98.7 F (37.1 C) (Oral)   Ht 5' 5.5" (1.664 m)   Wt 129 lb 6.4 oz (58.7 kg)   SpO2 99%   BMI 21.21 kg/m  Wt Readings from Last 3 Encounters:  10/13/16 129 lb 6.4 oz (58.7 kg)  02/01/16 156 lb 12 oz (71.1 kg)  10/21/15 155 lb 8 oz (70.5 kg)   POCT rapid strep- positive     Assessment & Plan:  1. Exudative pharyngitis - POCT rapid strep A- positive - amoxicillin (AMOXIL) 500 MG tablet; Take 1 tablet (500 mg total) by mouth 2 (two) times daily.  Dispense: 20 tablet; Refill: 0- she reports diarrhea with augmentin, but is able to tolerate amoxicillin - Provided written and verbal information regarding diagnosis and treatment. - RTC/ED precautions reviewed  2. Anal fissure - she would prefer care closer to  home, will refer her to Romie Levee, MD - Ambulatory referral to General Surgery   Olean Ree, FNP-BC  [facility], [facility]  10/13/2016 9:00 AM

## 2016-10-27 ENCOUNTER — Ambulatory Visit: Payer: Self-pay | Admitting: General Surgery

## 2016-10-27 DIAGNOSIS — K602 Anal fissure, unspecified: Secondary | ICD-10-CM | POA: Diagnosis not present

## 2016-10-27 NOTE — H&P (Signed)
History of Present Illness Romie Levee MD; 10/27/2016 2:23 PM) The patient is a 46 year old female who presents with anal pain. 46 year old female who presents to the office with approximately 10 month history of anal pain. this occurred after an episode of diarrhea. She had seen a physician in [place] who had started diltiazem ointment. she has been using this as directed for the past several months. She states that her pain is better than it was but she continues to have episodes of irritation at least once a month. She is on a high-fiber diet and takes a fiber supplement daily. She has soft stools unless she is on antibiotics. She denies any rectal bleeding currently. She had her internal hemorrhoids treated with sclerotherapy in [place] as well.   Past Surgical History Michel Bickers, LPN; 45/40/9811 2:09 PM) Appendectomy Oral Surgery  Diagnostic Studies History Michel Bickers, LPN; 91/47/8295 2:09 PM) Colonoscopy never Mammogram 1-3 years ago Pap Smear 1-5 years ago  Allergies Michel Bickers, LPN; 62/13/0865 2:13 PM) Augmentin *PENICILLINS* Diarrhea. Allergies Reconciled  Medication History Michel Bickers, LPN; 78/46/9629 2:14 PM) DILTIAZEM GEL (2% Gel, External) Active. Melatonin (5MG  Tablet, Oral) Active. Fiber (Oral) Active. Slippery Elm Bark (400MG  Capsule, Oral) Active. Medications Reconciled  Social History Michel Bickers, LPN; 52/84/1324 2:09 PM) Alcohol use Occasional alcohol use. Caffeine use Carbonated beverages, Coffee. Illicit drug use Uses socially only. Tobacco use Current some day smoker.  Family History Michel Bickers, LPN; 40/10/2723 2:09 PM) Cancer Family Members In General, Father. Colon Polyps Mother. Depression Mother. Heart Disease Father. Heart disease in female family member before age 42 Hypertension Father, Mother. Kidney Disease Father. Respiratory Condition Family Members In General, Mother. Thyroid problems  Mother.  Pregnancy / Birth History Michel Bickers, LPN; 36/64/4034 2:09 PM) Age at menarche 14 years. Contraceptive History Oral contraceptives. Gravida 4 Maternal age 68-35 Para 2 Regular periods  Other Problems Michel Bickers, LPN; 74/25/9563 2:09 PM) Anxiety Disorder Back Pain Depression Diabetes Mellitus Gastroesophageal Reflux Disease Hemorrhoids     Review of Systems Tresa Endo Dockery LPN; 87/56/4332 2:09 PM) General Present- Appetite Loss, Fatigue and Weight Loss. Not Present- Chills, Fever, Night Sweats and Weight Gain. Skin Present- Non-Healing Wounds. Not Present- Change in Wart/Mole, Dryness, Hives, Jaundice, New Lesions, Rash and Ulcer. HEENT Present- Seasonal Allergies and Wears glasses/contact lenses. Not Present- Earache, Hearing Loss, Hoarseness, Nose Bleed, Oral Ulcers, Ringing in the Ears, Sinus Pain, Sore Throat, Visual Disturbances and Yellow Eyes. Respiratory Present- Snoring. Not Present- Bloody sputum, Chronic Cough, Difficulty Breathing and Wheezing. Gastrointestinal Present- Bloody Stool, Change in Bowel Habits, Chronic diarrhea, Excessive gas, Gets full quickly at meals, Hemorrhoids and Rectal Pain. Not Present- Abdominal Pain, Bloating, Constipation, Difficulty Swallowing, Indigestion, Nausea and Vomiting. Psychiatric Present- Anxiety, Change in Sleep Pattern, Depression and Fearful. Not Present- Bipolar and Frequent crying. Endocrine Present- Hot flashes. Not Present- Cold Intolerance, Excessive Hunger, Hair Changes, Heat Intolerance and New Diabetes.  Vitals Tresa Endo Dockery LPN; 95/18/8416 2:12 PM) 10/27/2016 2:12 PM Weight: 129.6 lb Height: 67in Body Surface Area: 1.68 m Body Mass Index: 20.3 kg/m  Temp.: 99.77F(Oral)  Pulse: 64 (Regular)  BP: 116/62 (Sitting, Left Arm, Standard)      Physical Exam Romie Levee MD; 10/27/2016 2:26 PM)  General Mental Status-Alert. General Appearance-Not in acute distress. Build &  Nutrition-Well nourished. Posture-Normal posture. Gait-Normal.  Head and Neck Head-normocephalic, atraumatic with no lesions or palpable masses. Trachea-midline.  Chest and Lung Exam Chest and lung exam reveals -on auscultation, normal breath sounds, no adventitious sounds  and normal vocal resonance.  Cardiovascular Cardiovascular examination reveals -normal heart sounds, regular rate and rhythm with no murmurs and no digital clubbing, cyanosis, edema, increased warmth or tenderness.  Abdomen Inspection Inspection of the abdomen reveals - No Hernias. Palpation/Percussion Palpation and Percussion of the abdomen reveal - Soft, Non Tender, No Rigidity (guarding), No hepatosplenomegaly and No Palpable abdominal masses.  Rectal Anorectal Exam External - anal fissure. Internal - increased sphincter tone.  Neurologic Neurologic evaluation reveals -alert and oriented x 3 with no impairment of recent or remote memory, normal attention span and ability to concentrate, normal sensation and normal coordination.  Musculoskeletal Normal Exam - Bilateral-Upper Extremity Strength Normal and Lower Extremity Strength Normal.    Assessment & Plan Romie Levee(Sharnell Knight MD; 10/27/2016 2:24 PM)  ANAL FISSURE (K60.2) Impression: 46 year old female who presents to the office with complaints of a chronic anal fissure. On exam she has a posterior midline fissure and sphincter hypertension. We discussed surgical options including lateral internal sphincterotomy and Botox injection. She would like to proceed with radical sphincterotomy at this time. We discussed the chance of recurrence in the future as the Botox wears off. We discussed a temporary risk and incontinence as well.

## 2016-12-08 ENCOUNTER — Encounter (HOSPITAL_COMMUNITY): Payer: Self-pay

## 2016-12-08 ENCOUNTER — Other Ambulatory Visit: Payer: Self-pay

## 2016-12-08 NOTE — Progress Notes (Signed)
Spoke with Breanna Tran, No food after midnight 12/14/16.  May have clear liquids up until 8am 12/15/16, no gum candy of mints.  Arrival time 12 noon. I stat 4 needed DOS.  No medications will be taken the morning of surgery.  Pre op orders are in epic.  Sharee PimpleSteven Moorefield [phone]([phone]) will be accompanying Breanna Tran DOS and will be her ride home

## 2016-12-15 ENCOUNTER — Encounter (HOSPITAL_BASED_OUTPATIENT_CLINIC_OR_DEPARTMENT_OTHER)
Admission: RE | Disposition: A | Discharge status: Home / Self Care | Payer: Self-pay | Source: Ambulatory Visit | Attending: General Surgery

## 2016-12-15 ENCOUNTER — Ambulatory Visit (HOSPITAL_BASED_OUTPATIENT_CLINIC_OR_DEPARTMENT_OTHER): Payer: BLUE CROSS/BLUE SHIELD | Admitting: Anesthesiology

## 2016-12-15 ENCOUNTER — Encounter (HOSPITAL_BASED_OUTPATIENT_CLINIC_OR_DEPARTMENT_OTHER): Payer: Self-pay

## 2016-12-15 ENCOUNTER — Ambulatory Visit (HOSPITAL_COMMUNITY)
Admission: RE | Admit: 2016-12-15 | Discharge: 2016-12-15 | Disposition: A | Discharge status: Home / Self Care | Payer: BLUE CROSS/BLUE SHIELD | Source: Ambulatory Visit | Attending: General Surgery | Admitting: General Surgery

## 2016-12-15 DIAGNOSIS — K601 Chronic anal fissure: Secondary | ICD-10-CM | POA: Insufficient documentation

## 2016-12-15 DIAGNOSIS — Z9049 Acquired absence of other specified parts of digestive tract: Secondary | ICD-10-CM | POA: Insufficient documentation

## 2016-12-15 DIAGNOSIS — Z880 Allergy status to penicillin: Secondary | ICD-10-CM | POA: Insufficient documentation

## 2016-12-15 DIAGNOSIS — Z836 Family history of other diseases of the respiratory system: Secondary | ICD-10-CM | POA: Diagnosis not present

## 2016-12-15 DIAGNOSIS — Z818 Family history of other mental and behavioral disorders: Secondary | ICD-10-CM | POA: Insufficient documentation

## 2016-12-15 DIAGNOSIS — Z9889 Other specified postprocedural states: Secondary | ICD-10-CM | POA: Insufficient documentation

## 2016-12-15 DIAGNOSIS — Z8249 Family history of ischemic heart disease and other diseases of the circulatory system: Secondary | ICD-10-CM | POA: Diagnosis not present

## 2016-12-15 DIAGNOSIS — Z87891 Personal history of nicotine dependence: Secondary | ICD-10-CM | POA: Insufficient documentation

## 2016-12-15 DIAGNOSIS — Z841 Family history of disorders of kidney and ureter: Secondary | ICD-10-CM | POA: Diagnosis not present

## 2016-12-15 DIAGNOSIS — Z8371 Family history of colonic polyps: Secondary | ICD-10-CM | POA: Diagnosis not present

## 2016-12-15 DIAGNOSIS — Z8349 Family history of other endocrine, nutritional and metabolic diseases: Secondary | ICD-10-CM | POA: Insufficient documentation

## 2016-12-15 DIAGNOSIS — Z79899 Other long term (current) drug therapy: Secondary | ICD-10-CM | POA: Insufficient documentation

## 2016-12-15 HISTORY — PX: SPHINCTEROTOMY: SHX5279

## 2016-12-15 HISTORY — DX: Streptococcal pharyngitis: J02.0

## 2016-12-15 HISTORY — DX: Other specified postprocedural states: Z98.890

## 2016-12-15 HISTORY — DX: Nausea with vomiting, unspecified: R11.2

## 2016-12-15 HISTORY — DX: Gestational diabetes mellitus in pregnancy, unspecified control: O24.419

## 2016-12-15 LAB — POCT I-STAT 4, (NA,K, GLUC, HGB,HCT)
Glucose, Bld: 67 mg/dL (ref 65–99)
HEMATOCRIT: 37 % (ref 36.0–46.0)
Hemoglobin: 12.6 g/dL (ref 12.0–15.0)
Potassium: 3.8 mmol/L (ref 3.5–5.1)
Sodium: 143 mmol/L (ref 135–145)

## 2016-12-15 SURGERY — SPHINCTEROTOMY, ANAL
Anesthesia: Monitor Anesthesia Care

## 2016-12-15 MED ORDER — HYDROMORPHONE HCL 1 MG/ML IJ SOLN
0.2500 mg | INTRAMUSCULAR | Status: DC | PRN
  Filled 2016-12-15: qty 0.5

## 2016-12-15 MED ORDER — SODIUM CHLORIDE 0.9% FLUSH
3.0000 mL | Freq: Two times a day (BID) | INTRAVENOUS | Status: DC
  Filled 2016-12-15: qty 3

## 2016-12-15 MED ORDER — MIDAZOLAM HCL 5 MG/5ML IJ SOLN
INTRAMUSCULAR | Status: DC | PRN
  Administered 2016-12-15: 2 mg via INTRAVENOUS

## 2016-12-15 MED ORDER — LACTATED RINGERS IV SOLN
INTRAVENOUS | Status: DC
  Administered 2016-12-15: 1000 mL via INTRAVENOUS
  Filled 2016-12-15: qty 1000

## 2016-12-15 MED ORDER — ACETAMINOPHEN 650 MG RE SUPP
650.0000 mg | RECTAL | Status: DC | PRN
  Filled 2016-12-15: qty 1

## 2016-12-15 MED ORDER — PROPOFOL 500 MG/50ML IV EMUL
INTRAVENOUS | Status: DC | PRN
  Administered 2016-12-15: 50 ug/kg/min via INTRAVENOUS

## 2016-12-15 MED ORDER — ONABOTULINUMTOXINA 100 UNITS IJ SOLR
INTRAMUSCULAR | Status: DC | PRN
  Administered 2016-12-15: 100 [IU] via INTRAMUSCULAR

## 2016-12-15 MED ORDER — PROPOFOL 10 MG/ML IV BOLUS
INTRAVENOUS | Status: DC | PRN
  Administered 2016-12-15: 40 mg via INTRAVENOUS

## 2016-12-15 MED ORDER — FENTANYL CITRATE (PF) 250 MCG/5ML IJ SOLN
INTRAMUSCULAR | Status: DC | PRN
  Administered 2016-12-15: 50 ug via INTRAVENOUS

## 2016-12-15 MED ORDER — LACTATED RINGERS IV SOLN
INTRAVENOUS | Status: DC
  Filled 2016-12-15: qty 1000

## 2016-12-15 MED ORDER — ACETAMINOPHEN 325 MG PO TABS
650.0000 mg | ORAL_TABLET | ORAL | Status: DC | PRN
  Filled 2016-12-15: qty 2

## 2016-12-15 MED ORDER — OXYCODONE HCL 5 MG PO TABS
5.0000 mg | ORAL_TABLET | Freq: Once | ORAL | Status: DC | PRN
  Filled 2016-12-15: qty 1

## 2016-12-15 MED ORDER — ACETAMINOPHEN 500 MG PO TABS
ORAL_TABLET | ORAL | Status: AC
  Filled 2016-12-15: qty 2

## 2016-12-15 MED ORDER — FENTANYL CITRATE (PF) 100 MCG/2ML IJ SOLN
INTRAMUSCULAR | Status: AC
  Filled 2016-12-15: qty 2

## 2016-12-15 MED ORDER — MIDAZOLAM HCL 2 MG/2ML IJ SOLN
INTRAMUSCULAR | Status: AC
  Filled 2016-12-15: qty 2

## 2016-12-15 MED ORDER — SODIUM CHLORIDE 0.9 % IJ SOLN
INTRAMUSCULAR | Status: AC
  Filled 2016-12-15: qty 100

## 2016-12-15 MED ORDER — OXYCODONE HCL 5 MG/5ML PO SOLN
5.0000 mg | Freq: Once | ORAL | Status: DC | PRN
  Filled 2016-12-15: qty 5

## 2016-12-15 MED ORDER — SODIUM CHLORIDE 0.9 % IV SOLN
250.0000 mL | INTRAVENOUS | Status: DC | PRN
  Filled 2016-12-15: qty 250

## 2016-12-15 MED ORDER — PROPOFOL 500 MG/50ML IV EMUL
INTRAVENOUS | Status: AC
  Filled 2016-12-15: qty 50

## 2016-12-15 MED ORDER — HYDROCODONE-ACETAMINOPHEN 5-325 MG PO TABS: 1.0000 | ORAL_TABLET | ORAL | 0 refills | Status: DC | PRN

## 2016-12-15 MED ORDER — OXYCODONE HCL 5 MG PO TABS
5.0000 mg | ORAL_TABLET | ORAL | Status: DC | PRN
  Filled 2016-12-15: qty 2

## 2016-12-15 MED ORDER — LIDOCAINE 2% (20 MG/ML) 5 ML SYRINGE
INTRAMUSCULAR | Status: DC | PRN
  Administered 2016-12-15: 50 mg via INTRAVENOUS

## 2016-12-15 MED ORDER — SODIUM CHLORIDE 0.9 % IV SOLN
INTRAVENOUS | Status: AC | PRN
  Administered 2016-12-15: 50 mL via INTRAMUSCULAR

## 2016-12-15 MED ORDER — MEPERIDINE HCL 25 MG/ML IJ SOLN
6.2500 mg | INTRAMUSCULAR | Status: DC | PRN
  Filled 2016-12-15: qty 1

## 2016-12-15 MED ORDER — HYDROCODONE-ACETAMINOPHEN 5-325 MG PO TABS
1.0000 | ORAL_TABLET | ORAL | Status: DC | PRN
  Filled 2016-12-15: qty 1

## 2016-12-15 MED ORDER — BUPIVACAINE-EPINEPHRINE 0.5% -1:200000 IJ SOLN
INTRAMUSCULAR | Status: DC | PRN
  Administered 2016-12-15: 30 mL

## 2016-12-15 MED ORDER — PROMETHAZINE HCL 25 MG/ML IJ SOLN
6.2500 mg | INTRAMUSCULAR | Status: DC | PRN
  Filled 2016-12-15: qty 1

## 2016-12-15 MED ORDER — PROPOFOL 10 MG/ML IV BOLUS
INTRAVENOUS | Status: AC
  Filled 2016-12-15: qty 20

## 2016-12-15 MED ORDER — SODIUM CHLORIDE 0.9% FLUSH
3.0000 mL | INTRAVENOUS | Status: DC | PRN
  Filled 2016-12-15: qty 3

## 2016-12-15 MED ORDER — ONABOTULINUMTOXINA 100 UNITS IJ SOLR
INTRAMUSCULAR | Status: AC
Start: 2016-12-15 — End: ?
  Filled 2016-12-15: qty 200

## 2016-12-15 MED ORDER — HYDROCODONE-ACETAMINOPHEN 5-325 MG PO TABS
ORAL_TABLET | ORAL | Status: AC
  Filled 2016-12-15: qty 1

## 2016-12-15 MED ORDER — ACETAMINOPHEN 500 MG PO TABS
1000.0000 mg | ORAL_TABLET | ORAL | Status: AC
  Administered 2016-12-15: 500 mg via ORAL
  Filled 2016-12-15: qty 2

## 2016-12-15 MED ORDER — LIDOCAINE 2% (20 MG/ML) 5 ML SYRINGE
INTRAMUSCULAR | Status: AC
  Filled 2016-12-15: qty 5

## 2016-12-15 SURGICAL SUPPLY — 35 items
APL SKNCLS STERI-STRIP NONHPOA (GAUZE/BANDAGES/DRESSINGS) ×1
BENZOIN TINCTURE PRP APPL 2/3 (GAUZE/BANDAGES/DRESSINGS) ×2 IMPLANT
BLADE HEX COATED 2.75 (ELECTRODE) ×2 IMPLANT
BLADE SURG 15 STRL LF DISP TIS (BLADE) ×1 IMPLANT
BLADE SURG 15 STRL SS (BLADE) ×2
BRIEF STRETCH FOR OB PAD LRG (UNDERPADS AND DIAPERS) ×2 IMPLANT
COVER BACK TABLE 60X90IN (DRAPES) ×2 IMPLANT
COVER MAYO STAND STRL (DRAPES) ×2 IMPLANT
DRAPE LAPAROTOMY 100X72 PEDS (DRAPES) ×2 IMPLANT
DRAPE UTILITY XL STRL (DRAPES) ×2 IMPLANT
ELECT REM PT RETURN 9FT ADLT (ELECTROSURGICAL) ×2
ELECTRODE REM PT RTRN 9FT ADLT (ELECTROSURGICAL) ×1 IMPLANT
GAUZE SPONGE 4X4 12PLY STRL LF (GAUZE/BANDAGES/DRESSINGS) ×1 IMPLANT
GLOVE BIO SURGEON STRL SZ 6.5 (GLOVE) ×2 IMPLANT
GLOVE BIOGEL PI IND STRL 7.0 (GLOVE) ×1 IMPLANT
GLOVE BIOGEL PI INDICATOR 7.0 (GLOVE) ×1
GOWN STRL REUS W/TWL 2XL LVL3 (GOWN DISPOSABLE) ×2 IMPLANT
KIT RM TURNOVER CYSTO AR (KITS) ×2 IMPLANT
NDL SAFETY ECLIPSE 18X1.5 (NEEDLE) IMPLANT
NEEDLE HYPO 18GX1.5 SHARP (NEEDLE)
NEEDLE HYPO 22GX1.5 SAFETY (NEEDLE) ×2 IMPLANT
NS IRRIG 500ML POUR BTL (IV SOLUTION) ×2 IMPLANT
PACK BASIN DAY SURGERY FS (CUSTOM PROCEDURE TRAY) ×2 IMPLANT
PAD ABD 8X10 STRL (GAUZE/BANDAGES/DRESSINGS) ×2 IMPLANT
PAD ARMBOARD 7.5X6 YLW CONV (MISCELLANEOUS) IMPLANT
PENCIL BUTTON HOLSTER BLD 10FT (ELECTRODE) ×2 IMPLANT
SPONGE GAUZE 4X4 12PLY (GAUZE/BANDAGES/DRESSINGS) IMPLANT
SUT CHROMIC 2 0 SH (SUTURE) IMPLANT
SUT CHROMIC 3 0 SH 27 (SUTURE) IMPLANT
SYR CONTROL 10ML LL (SYRINGE) ×2 IMPLANT
TOWEL OR 17X24 6PK STRL BLUE (TOWEL DISPOSABLE) ×2 IMPLANT
TUBE CONNECTING 12X1/4 (SUCTIONS) ×2 IMPLANT
UNDERPAD 30X30 INCONTINENT (UNDERPADS AND DIAPERS) ×2 IMPLANT
WATER STERILE IRR 500ML POUR (IV SOLUTION) ×2 IMPLANT
YANKAUER SUCT BULB TIP NO VENT (SUCTIONS) ×2 IMPLANT

## 2016-12-15 NOTE — Discharge Instructions (Addendum)

## 2016-12-15 NOTE — Transfer of Care (Signed)
Immediate Anesthesia Transfer of Care Note  Patient: Breanna Tran  Procedure(s) Performed: CHEMICAL SPHINCTEROTOMY (BOTOX) (N/A )  Patient Location: PACU  Anesthesia Type:MAC  Level of Consciousness: awake, alert  and oriented  Airway & Oxygen Therapy: Patient Spontanous Breathing and Patient connected to nasal cannula oxygen  Post-op Assessment: Report given to RN  Post vital signs: Reviewed and stable  Last Vitals: 99/48, 72, 12, 100%, 98.2 Vitals:   12/15/16 1142  BP: (!) 119/53  Pulse: (!) 53  Resp: 16  Temp: 37 C  SpO2: 100%    Last Pain:  Vitals:   12/15/16 1231  TempSrc:   PainSc: 2       Patients Stated Pain Goal: 7 (43/60/67 7034)  Complications: No apparent anesthesia complications

## 2016-12-15 NOTE — Op Note (Addendum)
12/15/2016  1:59 PM  PATIENT:  Breanna Tran  46 y.o. female  Patient Care Team: Jearld Fenton, NP as PCP - General (Internal Medicine)  PRE-OPERATIVE DIAGNOSIS:  ANAL FISSURE CHRONIC  POST-OPERATIVE DIAGNOSIS:  ANAL FISSURE CHRONIC  PROCEDURE:   CHEMICAL SPHINCTEROTOMY (BOTOX)   Surgeon(s): Leighton Ruff, MD  ASSISTANT: none   ANESTHESIA:   local and MAC  SPECIMEN:  No Specimen  DISPOSITION OF SPECIMEN:  N/A  COUNTS:  YES  PLAN OF CARE: Discharge to home after PACU  PATIENT DISPOSITION:  PACU - hemodynamically stable.  INDICATION: 46 year old female with a chronic anal fissure.  She is tried diltiazem ointment and has had no change in her symptoms over the past few months.   OR FINDINGS: Posterior midline anal fissure  DESCRIPTION: the patient was identified in the preoperative holding area and taken to the OR where they were laid on the operating room table.  MAC anesthesia was induced without difficulty. The patient was then positioned in prone jackknife position with buttocks gently taped apart.  The patient was then prepped and draped in usual sterile fashion.  SCDs were noted to be in place prior to the initiation of anesthesia. A surgical timeout was performed indicating the correct patient, procedure, positioning and need for preoperative antibiotics.  A rectal block was performed using Marcaine with epinephrine.  I began with a digital rectal exam.  There were no masses palpated.   The patient has significant sphincter hypertension.  A small endoscope was inserted.  I identified the intersphincteric groove and injected 100 units of Botox into this intersphincteric space circumferentially.  Hemostasis was good at the end of the procedure.  All counts were correct per operating room staff.  Wells search: No matching patient identified.

## 2016-12-15 NOTE — Anesthesia Preprocedure Evaluation (Addendum)
Anesthesia Evaluation  Patient identified by MRN, date of birth, ID band Patient awake    Reviewed: Allergy & Precautions, NPO status , Patient's Chart, lab work & pertinent test results  History of Anesthesia Complications (+) PONV and history of anesthetic complications  Airway Mallampati: I  TM Distance: >3 FB Neck ROM: Full    Dental  (+) Teeth Intact, Dental Advisory Given   Pulmonary former smoker,    breath sounds clear to auscultation       Cardiovascular negative cardio ROS   Rhythm:Regular Rate:Normal     Neuro/Psych negative neurological ROS  negative psych ROS   GI/Hepatic negative GI ROS, Neg liver ROS,   Endo/Other    Renal/GU negative Renal ROS     Musculoskeletal negative musculoskeletal ROS (+)   Abdominal Normal abdominal exam  (+)   Peds  Hematology negative hematology ROS (+)   Anesthesia Other Findings   Reproductive/Obstetrics                            Anesthesia Physical Anesthesia Plan  ASA: II  Anesthesia Plan: MAC   Post-op Pain Management:    Induction: Intravenous  PONV Risk Score and Plan: 4 or greater and Propofol infusion, Ondansetron, TIVA, Midazolam and Dexamethasone  Airway Management Planned: Natural Airway and Nasal Cannula  Additional Equipment:   Intra-op Plan:   Post-operative Plan:   Informed Consent: I have reviewed the patients History and Physical, chart, labs and discussed the procedure including the risks, benefits and alternatives for the proposed anesthesia with the patient or authorized representative who has indicated his/her understanding and acceptance.     Plan Discussed with: CRNA  Anesthesia Plan Comments:         Anesthesia Quick Evaluation

## 2016-12-15 NOTE — H&P (Signed)
The patient is a 46 year old female who presents with anal pain. 46 year old female who presents to the office with approximately 10 month history of anal pain. this occurred after an episode of diarrhea. She had seen a physician in [place] wh[place]o had started diltiazem ointment. she has been using this as directed for the past several months. She states that her pain is better than it was but she continues to have episodes of irritation at least once a month. She is on a high-fiber diet and takes a fiber supplement daily. She has soft stools unless she is on antibiotics. She denies any rectal bleeding currently. She had her internal hemorrhoids treated with sclerotherapy in [place] as well.   Past Surgical History Michel Bickers(Kelly Dockery, LPN; 16/10/960410/19/2018 2:09 PM) Appendectomy Oral Surgery  Diagnostic Studies History Michel Bickers(Kelly Dockery, LPN; 54/09/811910/19/2018 2:09 PM) Colonoscopy never Mammogram 1-3 years ago Pap Smear 1-5 years ago  Allergies Michel Bickers(Kelly Dockery, LPN; 14/78/295610/19/2018 2:13 PM) Augmentin *PENICILLINS* Diarrhea. Allergies Reconciled  Medication History Michel Bickers(Kelly Dockery, LPN; 21/30/865710/19/2018 2:14 PM) DILTIAZEM GEL (2% Gel, External) Active. Melatonin (5MG  Tablet, Oral) Active. Fiber (Oral) Active. Slippery Elm Bark (400MG  Capsule, Oral) Active. Medications Reconciled  Social History Michel Bickers(Kelly Dockery, LPN; 84/69/629510/19/2018 2:09 PM) Alcohol use Occasional alcohol use. Caffeine use Carbonated beverages, Coffee. Illicit drug use Uses socially only. Tobacco use Current some day smoker.  Family History Michel Bickers(Kelly Dockery, LPN; 28/41/324410/19/2018 2:09 PM) Cancer Family Members In General, Father. Colon Polyps Mother. Depression Mother. Heart Disease Father. Heart disease in female family member before age 355 Hypertension Father, Mother. Kidney Disease Father. Respiratory Condition Family Members In General, Mother. Thyroid problems Mother.  Pregnancy / Birth History Michel Bickers(Kelly Dockery, LPN;  01/02/725310/19/2018 2:09 PM) Age at menarche 14 years. Contraceptive History Oral contraceptives. Gravida 4 Maternal age 44-35 Para 2 Regular periods  Other Problems Michel Bickers(Kelly Dockery, LPN; 66/44/034710/19/2018 2:09 PM) Anxiety Disorder Back Pain Depression Diabetes Mellitus Gastroesophageal Reflux Disease Hemorrhoids     Review of Systems General Present- Appetite Loss, Fatigue and Weight Loss. Not Present- Chills, Fever, Night Sweats and Weight Gain. Skin Present- Non-Healing Wounds. Not Present- Change in Wart/Mole, Dryness, Hives, Jaundice, New Lesions, Rash and Ulcer. HEENT Present- Seasonal Allergies and Wears glasses/contact lenses. Not Present- Earache, Hearing Loss, Hoarseness, Nose Bleed, Oral Ulcers, Ringing in the Ears, Sinus Pain, Sore Throat, Visual Disturbances and Yellow Eyes. Respiratory Present- Snoring. Not Present- Bloody sputum, Chronic Cough, Difficulty Breathing and Wheezing. Gastrointestinal Present- Bloody Stool, Change in Bowel Habits, Chronic diarrhea, Excessive gas, Gets full quickly at meals, Hemorrhoids and Rectal Pain. Not Present- Abdominal Pain, Bloating, Constipation, Difficulty Swallowing, Indigestion, Nausea and Vomiting. Psychiatric Present- Anxiety, Change in Sleep Pattern, Depression and Fearful. Not Present- Bipolar and Frequent crying. Endocrine Present- Hot flashes. Not Present- Cold Intolerance, Excessive Hunger, Hair Changes, Heat Intolerance and New Diabetes.  Ht 5' 7.5" (1.715 m)   Wt 59 kg (130 lb)   LMP 11/14/2016 (Approximate)   BMI 20.06 kg/m     Physical Exam   General Mental Status-Alert. General Appearance-Not in acute distress. Build & Nutrition-Well nourished. Posture-Normal posture. Gait-Normal.  Head and Neck Head-normocephalic, atraumatic with no lesions or palpable masses. Trachea-midline.  Chest and Lung Exam Chest and lung exam reveals -on auscultation, normal breath sounds, no adventitious  sounds and normal vocal resonance.  Cardiovascular Cardiovascular examination reveals -normal heart sounds, regular rate and rhythm with no murmurs and no digital clubbing, cyanosis, edema, increased warmth or tenderness.  Abdomen Inspection Inspection of the abdomen reveals - No Hernias. Palpation/Percussion  Palpation and Percussion of the abdomen reveal - Soft, Non Tender, No Rigidity (guarding), No hepatosplenomegaly and No Palpable abdominal masses.  Rectal Anorectal Exam External - anal fissure. Internal - increased sphincter tone.  Neurologic Neurologic evaluation reveals -alert and oriented x 3 with no impairment of recent or remote memory, normal attention span and ability to concentrate, normal sensation and normal coordination.  Musculoskeletal Normal Exam - Bilateral-Upper Extremity Strength Normal and Lower Extremity Strength Normal.    Assessment & Plan   ANAL FISSURE (K60.2) Impression: 46 year old female who presents to the office with complaints of a chronic anal fissure. On exam she has a posterior midline fissure and sphincter hypertension. We discussed surgical options including lateral internal sphincterotomy and Botox injection. She would like to proceed with chemical sphincterotomy at this time. We discussed the chance of recurrence in the future as the Botox wears off. We discussed a temporary risk and incontinence as well.

## 2016-12-15 NOTE — Anesthesia Procedure Notes (Signed)
Procedure Name: MAC Date/Time: 12/15/2016 1:36 PM Performed by: Bonney Aid, CRNA Pre-anesthesia Checklist: Patient identified, Timeout performed, Emergency Drugs available, Suction available and Patient being monitored Patient Re-evaluated:Patient Re-evaluated prior to induction Oxygen Delivery Method: Nasal cannula Placement Confirmation: positive ETCO2

## 2016-12-18 NOTE — Anesthesia Postprocedure Evaluation (Signed)
Anesthesia Post Note  Patient: Breanna Tran  Procedure(s) Performed: CHEMICAL SPHINCTEROTOMY (BOTOX) (N/A )     Patient location during evaluation: PACU Anesthesia Type: MAC Level of consciousness: awake and alert Pain management: pain level controlled Vital Signs Assessment: post-procedure vital signs reviewed and stable Respiratory status: spontaneous breathing, nonlabored ventilation, respiratory function stable and patient connected to nasal cannula oxygen Cardiovascular status: blood pressure returned to baseline and stable Postop Assessment: no apparent nausea or vomiting Anesthetic complications: no    Last Vitals:  Vitals:   12/15/16 1445 12/15/16 1545  BP: 100/64 (!) 96/53  Pulse: (!) 59 (!) 54  Resp: 13 14  Temp:  36.7 C  SpO2: 100% 100%    Last Pain:  Vitals:   12/15/16 1556  TempSrc:   PainSc: [name]

## 2016-12-19 ENCOUNTER — Encounter (HOSPITAL_BASED_OUTPATIENT_CLINIC_OR_DEPARTMENT_OTHER): Payer: Self-pay | Admitting: General Surgery

## 2017-05-01 DIAGNOSIS — K602 Anal fissure, unspecified: Secondary | ICD-10-CM | POA: Diagnosis not present

## 2017-05-03 DIAGNOSIS — K602 Anal fissure, unspecified: Secondary | ICD-10-CM | POA: Diagnosis not present

## 2017-05-15 ENCOUNTER — Ambulatory Visit: Payer: BLUE CROSS/BLUE SHIELD | Admitting: Internal Medicine

## 2017-05-21 ENCOUNTER — Encounter: Payer: Self-pay | Admitting: Internal Medicine

## 2017-05-21 ENCOUNTER — Ambulatory Visit: Payer: BLUE CROSS/BLUE SHIELD | Admitting: Internal Medicine

## 2017-05-21 VITALS — BP 106/74 | HR 76 | Temp 98.9°F | Wt 134.0 lb

## 2017-05-21 DIAGNOSIS — F5104 Psychophysiologic insomnia: Secondary | ICD-10-CM | POA: Diagnosis not present

## 2017-05-21 DIAGNOSIS — F419 Anxiety disorder, unspecified: Secondary | ICD-10-CM | POA: Diagnosis not present

## 2017-05-21 DIAGNOSIS — N951 Menopausal and female climacteric states: Secondary | ICD-10-CM

## 2017-05-21 MED ORDER — TRAZODONE HCL 50 MG PO TABS
25.0000 mg | ORAL_TABLET | Freq: Every evening | ORAL | 0 refills | Status: DC | PRN
Start: 2017-05-21 — End: 2017-06-12

## 2017-05-21 MED ORDER — ALPRAZOLAM 0.25 MG PO TABS: 0.2500 mg | ORAL_TABLET | Freq: Every day | ORAL | 0 refills | Status: DC | PRN

## 2017-05-21 NOTE — Assessment & Plan Note (Signed)
She is not interested in HRT, SSRI's or SNRI's at this time

## 2017-05-21 NOTE — Assessment & Plan Note (Signed)
Likely r/t menopause Discussed treatment with Effexor, Paxil, etc She does not want to take something daily and reports she is very sensitive to medications Will trial Xanax 0.25 mg #20. Discussed sedation and addiction possibilities Support offered today

## 2017-05-21 NOTE — Patient Instructions (Signed)

## 2017-05-21 NOTE — Assessment & Plan Note (Signed)
Will trial Trazadone She will update me in 3-4 weeks and let me know how this is working for her Discussed a good bedtime routine, try sticking to it

## 2017-05-21 NOTE — Progress Notes (Signed)
Subjective:    Patient ID: Breanna Tran, female    DOB: Sep 13, 1970, 47 y.o.   MRN: [id number]  HPI  Pt presents to the clinic today with c/o insomnia. She reports over the last year, she has noticed that she is having more trouble sleeping at night. She is able to fall asleep, but can't stay asleep. She has to get up to go urinate once per night and has had trouble going back to sleep after that. She feels like the lack of sleep has increased her anxiety during the day and leaves her feeling exhausted in the daytime. She is not sure if these symptoms are related to menopause but she reports she has not had a period in 4 months. She is also experiencing cold flushes, moodiness and irritability. She has tried Melatonin, but she reports it give her horrendus diarrhea. She has been treated for anxiety in the past with Prozac but reports she was not able to tolerate it well due to side effects. She reports her anxiety isn't everyday, it just comes and goes. She denies depression, SI/HI.  Review of Systems      Past Medical History:  Diagnosis Date  . Genital warts due to HPV (human papillomavirus) 2000   wart removed.   . Gestational diabetes    2 nd pregnancy only  . PONV (postoperative nausea and vomiting)   . Strep throat    10/18    Current Outpatient Medications  Medication Sig Dispense Refill  . ibuprofen (ADVIL,MOTRIN) 600 MG tablet Take 600 mg by mouth every 6 (six) hours as needed for mild pain.    Marland Kitchen PSYLLIUM HUSK PO Take 5 capsules by mouth as needed.      No current facility-administered medications for this visit.     Allergies  Allergen Reactions  . Augmentin [Amoxicillin-Pot Clavulanate] Diarrhea    Family History  Problem Relation Age of Onset  . Cancer Father        renal  . Cancer Maternal Grandfather        lung  . Graves' disease Mother   . Brain cancer Maternal Uncle     Social History   Socioeconomic History  . Marital status: Married    Spouse  name: Not on file  . Number of children: Not on file  . Years of education: Not on file  . Highest education level: Not on file  Occupational History  . Not on file  Social Needs  . Financial resource strain: Not on file  . Food insecurity:    Worry: Not on file    Inability: Not on file  . Transportation needs:    Medical: Not on file    Non-medical: Not on file  Tobacco Use  . Smoking status: Former Smoker    Packs/day: 0.50    Years: 15.00    Pack years: 7.50    Types: E-cigarettes    Last attempt to quit: 01/10/2012    Years since quitting: 5.3  . Smokeless tobacco: Never Used  Substance and Sexual Activity  . Alcohol use: Yes    Alcohol/week: 0.6 oz    Types: 1 Glasses of wine per week    Comment: occ  . Drug use: No  . Sexual activity: Yes    Birth control/protection: Condom  Lifestyle  . Physical activity:    Days per week: Not on file    Minutes per session: Not on file  . Stress: Not on file  Relationships  .  Social connections:    Talks on phone: Not on file    Gets together: Not on file    Attends religious service: Not on file    Active member of club or organization: Not on file    Attends meetings of clubs or organizations: Not on file    Relationship status: Not on file  . Intimate partner violence:    Fear of current or ex partner: Not on file    Emotionally abused: Not on file    Physically abused: Not on file    Forced sexual activity: Not on file  Other Topics Concern  . Not on file  Social History Narrative  . Not on file     Constitutional: Denies fever, malaise, fatigue, headache or abrupt weight changes.  Respiratory: Denies difficulty breathing, shortness of breath, cough or sputum production.   Cardiovascular: Denies chest pain, chest tightness, palpitations or swelling in the hands or feet.  Neurological: Pt reports insomnia. Denies dizziness, difficulty with memory, difficulty with speech or problems with balance and coordination.    Psych: Pt reports anxiety. Denies depression, SI/HI.  No other specific complaints in a complete review of systems (except as listed in HPI above).  Objective:   Physical Exam  BP 106/74   Pulse 76   Temp 98.9 F (37.2 C) (Oral)   Wt 134 lb (60.8 kg)   LMP 01/10/2017 (Approximate)   SpO2 98%   BMI 20.68 kg/m  Wt Readings from Last 3 Encounters:  05/21/17 134 lb (60.8 kg)  12/15/16 130 lb 3.2 oz (59.1 kg)  10/13/16 129 lb 6.4 oz (58.7 kg)    General: Appears her stated age, well developed, well nourished in NAD. Cardiovascular: Normal rate and rhythm. S1,S2 noted.  No murmur, rubs or gallops noted.  Pulmonary/Chest: Normal effort and positive vesicular breath sounds. No respiratory distress. No wheezes, rales or ronchi noted.  Neurological: Alert and oriented.  Psychiatric: Mood and affect normal. Behavior is normal. Judgment and thought content normal.     BMET    Component Value Date/Time   NA 143 12/15/2016 1235   K 3.8 12/15/2016 1235   CL 105 09/20/2015 1035   CO2 30 09/20/2015 1035   GLUCOSE 67 12/15/2016 1235   BUN 7 09/20/2015 1035   CREATININE 0.68 09/20/2015 1035   CREATININE 0.81 05/30/2012 0859   CALCIUM 9.1 09/20/2015 1035    Lipid Panel     Component Value Date/Time   CHOL 158 09/20/2015 1035   TRIG 111.0 09/20/2015 1035   HDL 43.40 09/20/2015 1035   CHOLHDL 4 09/20/2015 1035   VLDL 22.2 09/20/2015 1035   LDLCALC 93 09/20/2015 1035    CBC    Component Value Date/Time   WBC 6.6 09/20/2015 1035   RBC 4.53 09/20/2015 1035   HGB 12.6 12/15/2016 1235   HCT 37.0 12/15/2016 1235   PLT 224.0 09/20/2015 1035   MCV 88.6 09/20/2015 1035   MCH 28.7 05/30/2012 0859   MCHC 33.9 09/20/2015 1035   RDW 12.1 09/20/2015 1035    Hgb A1C No results found for: HGBA1C          Assessment & Plan:

## 2017-06-12 ENCOUNTER — Other Ambulatory Visit: Payer: Self-pay | Admitting: Internal Medicine

## 2017-06-12 DIAGNOSIS — F5104 Psychophysiologic insomnia: Secondary | ICD-10-CM

## 2017-06-13 ENCOUNTER — Telehealth: Payer: Self-pay | Admitting: Internal Medicine

## 2017-06-13 NOTE — Telephone Encounter (Signed)
Please review for generic Lexapro.  LOV 05/21/17 NOV 06/18/17 Nicki Reaperegina Baity, NP [id number]

## 2017-06-13 NOTE — Telephone Encounter (Signed)
Copied from CRM [id number]. Topic: Quick Communication - Rx Refill/Question >> Jun 13, 2017  8:18 AM Cipriano BunkerLambe, Annette S wrote: Medication:  pt. Saw [name] 5/13 and spoke of medication to help with anxiety.  Pt. Is wanting to try generic Lexapro. Asking if can be called in for her. She states may start off with low dose.   Has the patient contacted their pharmacy? No. (Agent: If no, request that the patient contact the pharmacy for the refill.) (Agent: If yes, when and what did the pharmacy advise?)  Preferred Pharmacy (with phone number or street name):   [id number] [place] - [address] [place] [id number] Phone: [phone] Fax: [phone]    Agent: Please be advised that RX refills may take up to 3 business days. We ask that you follow-up with your pharmacy.

## 2017-06-14 NOTE — Telephone Encounter (Signed)
Last filled 05/21/17... Please advise if okay for pt to continue as pt is also requesting to start daily anxiety medication... See CRM

## 2017-06-15 NOTE — Telephone Encounter (Signed)
She was supposed to update me and let me know how Trazadone is working. As far as daily anxiety medication, I can send in a SSRI for her. How often is she taking the Xanax?

## 2017-06-18 ENCOUNTER — Ambulatory Visit (INDEPENDENT_AMBULATORY_CARE_PROVIDER_SITE_OTHER): Payer: BLUE CROSS/BLUE SHIELD | Admitting: Internal Medicine

## 2017-06-18 ENCOUNTER — Other Ambulatory Visit (HOSPITAL_COMMUNITY)
Admission: RE | Admit: 2017-06-18 | Discharge: 2017-06-18 | Disposition: A | Discharge status: Home / Self Care | Payer: BLUE CROSS/BLUE SHIELD | Source: Ambulatory Visit | Attending: Internal Medicine | Admitting: Internal Medicine

## 2017-06-18 ENCOUNTER — Encounter: Payer: Self-pay | Admitting: Internal Medicine

## 2017-06-18 VITALS — BP 102/60 | HR 61 | Temp 98.2°F | Ht 65.75 in | Wt 135.5 lb

## 2017-06-18 DIAGNOSIS — Z1151 Encounter for screening for human papillomavirus (HPV): Secondary | ICD-10-CM | POA: Diagnosis not present

## 2017-06-18 DIAGNOSIS — Z0000 Encounter for general adult medical examination without abnormal findings: Secondary | ICD-10-CM

## 2017-06-18 DIAGNOSIS — E559 Vitamin D deficiency, unspecified: Secondary | ICD-10-CM

## 2017-06-18 DIAGNOSIS — F419 Anxiety disorder, unspecified: Secondary | ICD-10-CM

## 2017-06-18 DIAGNOSIS — F5104 Psychophysiologic insomnia: Secondary | ICD-10-CM | POA: Diagnosis not present

## 2017-06-18 DIAGNOSIS — N959 Unspecified menopausal and perimenopausal disorder: Secondary | ICD-10-CM | POA: Diagnosis not present

## 2017-06-18 DIAGNOSIS — N951 Menopausal and female climacteric states: Secondary | ICD-10-CM | POA: Diagnosis not present

## 2017-06-18 LAB — CBC
HCT: 38.9 % (ref 36.0–46.0)
Hemoglobin: 13.2 g/dL (ref 12.0–15.0)
MCHC: 34 g/dL (ref 30.0–36.0)
MCV: 91.3 fl (ref 78.0–100.0)
Platelets: 256 10*3/uL (ref 150.0–400.0)
RBC: 4.26 Mil/uL (ref 3.87–5.11)
RDW: 13 % (ref 11.5–15.5)
WBC: 8.2 10*3/uL (ref 4.0–10.5)

## 2017-06-18 LAB — COMPREHENSIVE METABOLIC PANEL
ALBUMIN: 4.1 g/dL (ref 3.5–5.2)
ALT: 11 U/L (ref 0–35)
AST: 12 U/L (ref 0–37)
Alkaline Phosphatase: 43 U/L (ref 39–117)
BUN: 10 mg/dL (ref 6–23)
CHLORIDE: 103 meq/L (ref 96–112)
CO2: 30 meq/L (ref 19–32)
Calcium: 9.1 mg/dL (ref 8.4–10.5)
Creatinine, Ser: 0.64 mg/dL (ref 0.40–1.20)
GFR: 106.55 mL/min (ref >60.00)
GLUCOSE: 87 mg/dL (ref 70–99)
POTASSIUM: 3.9 meq/L (ref 3.5–5.1)
SODIUM: 139 meq/L (ref 135–145)
Total Bilirubin: 0.4 mg/dL (ref 0.2–1.2)
Total Protein: 6.8 g/dL (ref 6.0–8.3)

## 2017-06-18 LAB — LUTEINIZING HORMONE: LH: 7.4 m[IU]/mL

## 2017-06-18 LAB — LIPID PANEL
CHOL/HDL RATIO: 3
CHOLESTEROL: 143 mg/dL (ref 0–200)
HDL: 56.1 mg/dL (ref >39.00)
LDL CALC: 76 mg/dL (ref 0–99)
NonHDL: 87.38
Triglycerides: 55 mg/dL (ref 0.0–149.0)
VLDL: 11 mg/dL (ref 0.0–40.0)

## 2017-06-18 LAB — VITAMIN D 25 HYDROXY (VIT D DEFICIENCY, FRACTURES): VITD: 24.1 ng/mL — ABNORMAL LOW (ref 30.00–100.00)

## 2017-06-18 LAB — FOLLICLE STIMULATING HORMONE: FSH: 6.2 m[IU]/mL

## 2017-06-18 MED ORDER — ALPRAZOLAM 0.25 MG PO TABS: 0.2500 mg | ORAL_TABLET | Freq: Every day | ORAL | 0 refills | Status: DC | PRN

## 2017-06-18 MED ORDER — VENLAFAXINE HCL ER 37.5 MG PO CP24: 37.5000 mg | ORAL_CAPSULE | Freq: Every day | ORAL | 2 refills | Status: DC

## 2017-06-18 NOTE — Patient Instructions (Signed)

## 2017-06-18 NOTE — Telephone Encounter (Signed)
Spoke to pt who states trazodone is effective and she has been taking 1/2 tab of xanax daily. She states she has a CPE today and will discuss further at that time.

## 2017-06-18 NOTE — Progress Notes (Signed)
Subjective:    Patient ID: Breanna Tran, female    DOB: 21-May-1970, 47 y.o.   MRN: [id number]  HPI  Pt presents to the clinic today for her annual exam.   Anxiety/Insomnia secondary to Menopausal Symptoms.: She was recently started on Trazadone for sleep. She feels like it is working well. She is still having a lot of mood swings, anxiety and hot flashes. She has taken Xanax with some relief but reports she is out of it and would like a refill today.  Flu: never Tetanus: > 10 years ago Pap Smear: about 5 years ago [facility]ogram: [facility] Vision Screening: annually Dentist: biannually  Diet: She eats a little meat. She consumes fruits and veggies daily. .She troies to avoid fried foods. She drinks mostly water. Exercise none  Review of Systems      Past Medical History:  Diagnosis Date  . Genital warts due to HPV (human papillomavirus) 2000   wart removed.   . Gestational diabetes    2 nd pregnancy only  . PONV (postoperative nausea and vomiting)   . Strep throat    10/18    Current Outpatient Medications  Medication Sig Dispense Refill  . ALPRAZolam (XANAX) 0.25 MG tablet Take 1 tablet (0.25 mg total) by mouth daily as needed for anxiety. 20 tablet 0  . diltiazem 2 % GEL Apply 1 application topically 3 (three) times daily.    Marland Kitchen docusate sodium (COLACE) 100 MG capsule Take 200 mg by mouth at bedtime.    Marland Kitchen ibuprofen (ADVIL,MOTRIN) 600 MG tablet Take 600 mg by mouth every 6 (six) hours as needed for mild pain.    Marland Kitchen psyllium (REGULOID) 0.52 g capsule Take 5 g by mouth daily.    . traZODone (DESYREL) 50 MG tablet TAKE 0.5-1 TABLETS (25-50 MG TOTAL) BY MOUTH AT BEDTIME AS NEEDED FOR SLEEP. 90 tablet 0  . venlafaxine XR (EFFEXOR-XR) 37.5 MG 24 hr capsule Take 1 capsule (37.5 mg total) by mouth daily with breakfast. 30 capsule 2   No current facility-administered medications for this visit.     Allergies  Allergen Reactions  . Augmentin  [Amoxicillin-Pot Clavulanate] Diarrhea    Family History  Problem Relation Age of Onset  . Cancer Father        renal  . Cancer Maternal Grandfather        lung  . Graves' disease Mother   . Brain cancer Maternal Uncle     Social History   Socioeconomic History  . Marital status: Married    Spouse name: Not on file  . Number of children: Not on file  . Years of education: Not on file  . Highest education level: Not on file  Occupational History  . Not on file  Social Needs  . Financial resource strain: Not on file  . Food insecurity:    Worry: Not on file    Inability: Not on file  . Transportation needs:    Medical: Not on file    Non-medical: Not on file  Tobacco Use  . Smoking status: Former Smoker    Packs/day: 0.50    Years: 15.00    Pack years: 7.50    Types: E-cigarettes    Last attempt to quit: 01/10/2012    Years since quitting: 5.4  . Smokeless tobacco: Never Used  Substance and Sexual Activity  . Alcohol use: Yes    Alcohol/week: 0.6 oz    Types: 1 Glasses of wine  per week    Comment: occ  . Drug use: No  . Sexual activity: Yes    Birth control/protection: Condom  Lifestyle  . Physical activity:    Days per week: Not on file    Minutes per session: Not on file  . Stress: Not on file  Relationships  . Social connections:    Talks on phone: Not on file    Gets together: Not on file    Attends religious service: Not on file    Active member of club or organization: Not on file    Attends meetings of clubs or organizations: Not on file    Relationship status: Not on file  . Intimate partner violence:    Fear of current or ex partner: Not on file    Emotionally abused: Not on file    Physically abused: Not on file    Forced sexual activity: Not on file  Other Topics Concern  . Not on file  Social History Narrative  . Not on file     Constitutional: Denies fever, malaise, fatigue, headache or abrupt weight changes.  HEENT: Denies eye pain,  eye redness, ear pain, ringing in the ears, wax buildup, runny nose, nasal congestion, bloody nose, or sore throat. Respiratory: Denies difficulty breathing, shortness of breath, cough or sputum production.   Cardiovascular: Denies chest pain, chest tightness, palpitations or swelling in the hands or feet.  Gastrointestinal: Denies abdominal pain, bloating, constipation, diarrhea or blood in the stool.  GU: Denies urgency, frequency, pain with urination, burning sensation, blood in urine, odor or discharge. Musculoskeletal: Denies decrease in range of motion, difficulty with gait, muscle pain or joint pain and swelling.  Skin: Denies redness, rashes, lesions or ulcercations.  Neurological: Pt reports hot flashes, insomnia. Denies dizziness, difficulty with memory, difficulty with speech or problems with balance and coordination.  Psych: Pt reports anxiety. Denies depression, SI/HI.  No other specific complaints in a complete review of systems (except as listed in HPI above).  Objective:   Physical Exam   BP 102/60   Pulse 61   Temp 98.2 F (36.8 C) (Oral)   Ht 5' 5.75" (1.67 m)   Wt 135 lb 8 oz (61.5 kg)   SpO2 98%   BMI 22.04 kg/m  Wt Readings from Last 3 Encounters:  06/18/17 135 lb 8 oz (61.5 kg)  05/21/17 134 lb (60.8 kg)  12/15/16 130 lb 3.2 oz (59.1 kg)    General: Appears her stated age, well developed, well nourished in NAD. Skin: Warm, dry and intact.  HEENT: Head: normal shape and size; Eyes: sclera white, no icterus, conjunctiva pink, PERRLA and EOMs intact; Ears: Tm's gray and intact, normal light reflex; Throat/Mouth: Teeth present, mucosa pink and moist, no exudate, lesions or ulcerations noted.  Neck:  Neck supple, trachea midline. No masses, lumps or thyromegaly present.  Cardiovascular: Normal rate and rhythm. S1,S2 noted.  No murmur, rubs or gallops noted. No JVD or BLE edema.  Pulmonary/Chest: Normal effort and positive vesicular breath sounds. No respiratory  distress. No wheezes, rales or ronchi noted.  Abdomen: Soft and nontender. Normal bowel sounds. No distention or masses noted. Liver, spleen and kidneys non palpable. Pelvic: Normal female anatomy. Thick cream colored discharge noted at cervical os. No CMT. Adnexa non palpable.  Musculoskeletal: Strength 5/5 BUE/BLE. No difficulty with gait.  Neurological: Alert and oriented. Cranial nerves II-XII grossly intact. Coordination normal.  Psychiatric: Mood and affect normal. Behavior is normal. Judgment and thought content normal.  BMET    Component Value Date/Time   NA 139 06/18/2017 1102   K 3.9 06/18/2017 1102   CL 103 06/18/2017 1102   CO2 30 06/18/2017 1102   GLUCOSE 87 06/18/2017 1102   BUN 10 06/18/2017 1102   CREATININE 0.64 06/18/2017 1102   CREATININE 0.81 05/30/2012 0859   CALCIUM 9.1 06/18/2017 1102    Lipid Panel     Component Value Date/Time   CHOL 143 06/18/2017 1102   TRIG 55.0 06/18/2017 1102   HDL 56.10 06/18/2017 1102   CHOLHDL 3 06/18/2017 1102   VLDL 11.0 06/18/2017 1102   LDLCALC 76 06/18/2017 1102    CBC    Component Value Date/Time   WBC 8.2 06/18/2017 1102   RBC 4.26 06/18/2017 1102   HGB 13.2 06/18/2017 1102   HCT 38.9 06/18/2017 1102   PLT 256.0 06/18/2017 1102   MCV 91.3 06/18/2017 1102   MCH 28.7 05/30/2012 0859   MCHC 34.0 06/18/2017 1102   RDW 13.0 06/18/2017 1102    Hgb A1C No results found for: HGBA1C         Assessment & Plan:   Preventative Health Maintenance:  Encouraged her to get a flu shot in the fall She declines tetanus booster today Pap smear today- she declines STD screening Mammogram ordered, [facility] will [facility] to schedule, number provided Encouraged her to consume a balanced diet and exercise regimen Advised her to see an eye doctor and dentist annually Will check CBC, CMET, Lipid, Vit D  RTC in 1 year, sooner if needed Nicki Reaper, NP

## 2017-06-19 LAB — CYTOLOGY - PAP
Diagnosis: NEGATIVE
HPV: NOT DETECTED

## 2017-06-22 NOTE — Assessment & Plan Note (Signed)
Controlled on Trazadone Will monitor 

## 2017-06-22 NOTE — Assessment & Plan Note (Signed)
Persistent: Add Effexor to regimen, eRx sent to pharmacy Xanax refilled today

## 2017-06-22 NOTE — Assessment & Plan Note (Signed)
Will check FSH and LH today Add Effexor to regimen, eRx sent to pharmacy

## 2017-07-10 ENCOUNTER — Other Ambulatory Visit: Payer: Self-pay | Admitting: Internal Medicine

## 2017-07-22 ENCOUNTER — Other Ambulatory Visit: Payer: Self-pay | Admitting: Internal Medicine

## 2017-07-23 NOTE — Telephone Encounter (Signed)
Name of Medication: Xanax 0.25mg  Name of [facility]: [facility] [place] Last Fill or Written Date and Quantity: 06/18/17 #20 Last Office Visit and Type: 06/18/17 CPE Next Office Visit and Type: none Last Controlled Substance Agreement Date: none  Last UDS: none

## 2017-09-04 ENCOUNTER — Telehealth: Payer: Self-pay | Admitting: Internal Medicine

## 2017-09-04 MED ORDER — POLYETHYLENE GLYCOL 3350 17 GM/SCOOP PO POWD: 17.0000 g | Freq: Every day | ORAL | 1 refills | Status: DC

## 2017-09-04 NOTE — Addendum Note (Signed)
Addended by: Lorre MunroeBAITY, REGINA W on: 09/04/2017 02:29 PM   Modules accepted: Orders

## 2017-09-04 NOTE — Telephone Encounter (Signed)
Copied from CRM [id number]. Topic: Quick Communication - See Telephone Encounter >> Sep 04, 2017  1:49 PM Terisa Starraylor, Brittany L wrote: CRM for notification. See Telephone encounter for: 09/04/17.  Patient is requesting a prescription for anti-laxative (miralax). She said the venlafaxine XR (EFFEXOR-XR) 37.5 MG 24 hr capsule is making her constipated. She said if it is called in for prescription strength it is so much cheaper for her than over the counter. She stopped taking the stool softener. It was not helping  [id number] [place] [place] - [address] [place] [id number]

## 2017-09-04 NOTE — Telephone Encounter (Signed)
miralax sent to pharmacy

## 2017-09-08 ENCOUNTER — Other Ambulatory Visit: Payer: Self-pay | Admitting: Internal Medicine

## 2017-09-08 DIAGNOSIS — F5104 Psychophysiologic insomnia: Secondary | ICD-10-CM

## 2017-09-12 ENCOUNTER — Other Ambulatory Visit: Payer: Self-pay | Admitting: Internal Medicine

## 2017-09-12 DIAGNOSIS — Z1231 Encounter for screening mammogram for malignant neoplasm of breast: Secondary | ICD-10-CM

## 2017-10-03 ENCOUNTER — Other Ambulatory Visit: Payer: Self-pay | Admitting: Internal Medicine

## 2017-10-03 NOTE — Telephone Encounter (Signed)
See pt email msg with update on Effexor... Xanax last refilled 07/23/17... Please advise

## 2017-10-04 MED ORDER — ALPRAZOLAM 0.25 MG PO TABS: 0.2500 mg | ORAL_TABLET | Freq: Every day | ORAL | 0 refills | Status: DC | PRN

## 2017-10-04 NOTE — Telephone Encounter (Signed)
Does she want to increase effexor to 75 mg daily? Xanax refilled.

## 2017-10-12 ENCOUNTER — Ambulatory Visit: Payer: BLUE CROSS/BLUE SHIELD

## 2017-10-26 ENCOUNTER — Encounter: Payer: Self-pay | Admitting: Internal Medicine

## 2017-10-26 ENCOUNTER — Ambulatory Visit: Payer: BLUE CROSS/BLUE SHIELD | Admitting: Internal Medicine

## 2017-10-26 VITALS — BP 106/64 | HR 69 | Temp 98.6°F | Wt 134.0 lb

## 2017-10-26 DIAGNOSIS — M545 Low back pain, unspecified: Secondary | ICD-10-CM

## 2017-10-26 DIAGNOSIS — R197 Diarrhea, unspecified: Secondary | ICD-10-CM | POA: Diagnosis not present

## 2017-10-26 DIAGNOSIS — K649 Unspecified hemorrhoids: Secondary | ICD-10-CM

## 2017-10-26 MED ORDER — CYCLOBENZAPRINE HCL 5 MG PO TABS: 5.0000 mg | ORAL_TABLET | Freq: Three times a day (TID) | ORAL | 0 refills | Status: DC | PRN

## 2017-10-26 MED ORDER — HYDROCORTISONE ACETATE 25 MG RE SUPP: 25.0000 mg | Freq: Two times a day (BID) | RECTAL | 1 refills | Status: DC

## 2017-10-26 MED ORDER — IBUPROFEN 800 MG PO TABS: 800.0000 mg | ORAL_TABLET | Freq: Three times a day (TID) | ORAL | 0 refills | Status: DC | PRN

## 2017-10-26 NOTE — Progress Notes (Signed)
Subjective:    Patient ID: Breanna Tran, female    DOB: [birth year], 47 y.o.   MRN: [id number]  HPI  Pt presents to the clinic today with c/o hemorrhoids. She reports she intermittently has issues with this. The hemorrhoids cause rectal pain, itching and swelling. She has not had any rectal bleeding. She has had diarrhea for 3 days, but denies nausea, vomiting, fever, chills or body aches. She has tried Preparation H suppositories with minimal relief.   She also c/o low back pain. She reports this started 5 days ago. She describes the pain as sore and achy but can be sharp and stabbing with certain movements. She is having associated muscles spasms. The pain is worse with movement. She denies numbness, tingling or weakness of her lower extremities. She has tried Ibuprofen with some relief. She denies any injury to the area. She has seen a chiropractor in the past for similar issues.  Review of Systems      Past Medical History:  Diagnosis Date  . Genital warts due to HPV (human papillomavirus) 2000   wart removed.   . Gestational diabetes    2 nd pregnancy only  . PONV (postoperative nausea and vomiting)   . Strep throat    10/18    Current Outpatient Medications  Medication Sig Dispense Refill  . docusate sodium (COLACE) 100 MG capsule Take 200 mg by mouth at bedtime.    Marland Kitchen venlafaxine XR (EFFEXOR-XR) 37.5 MG 24 hr capsule TAKE 1 CAPSULE (37.5 MG TOTAL) BY MOUTH DAILY WITH BREAKFAST. 90 capsule 1  . cyclobenzaprine (FLEXERIL) 5 MG tablet Take 1 tablet (5 mg total) by mouth 3 (three) times daily as needed for muscle spasms. 20 tablet 0  . hydrocortisone (ANUSOL-HC) 25 MG suppository Place 1 suppository (25 mg total) rectally 2 (two) times daily. 14 suppository 1  . ibuprofen (ADVIL,MOTRIN) 800 MG tablet Take 1 tablet (800 mg total) by mouth every 8 (eight) hours as needed. 30 tablet 0  . psyllium (REGULOID) 0.52 g capsule Take 5 g by mouth daily.     No current  facility-administered medications for this visit.     Allergies  Allergen Reactions  . Augmentin [Amoxicillin-Pot Clavulanate] Diarrhea    Family History  Problem Relation Age of Onset  . Cancer Father        renal  . Cancer Maternal Grandfather        lung  . Graves' disease Mother   . Brain cancer Maternal Uncle     Social History   Socioeconomic History  . Marital status: Married    Spouse name: Not on file  . Number of children: Not on file  . Years of education: Not on file  . Highest education level: Not on file  Occupational History  . Not on file  Social Needs  . Financial resource strain: Not on file  . Food insecurity:    Worry: Not on file    Inability: Not on file  . Transportation needs:    Medical: Not on file    Non-medical: Not on file  Tobacco Use  . Smoking status: Former Smoker    Packs/day: 0.50    Years: 15.00    Pack years: 7.50    Types: E-cigarettes    Last attempt to quit: 01/10/2012    Years since quitting: 5.7  . Smokeless tobacco: Never Used  Substance and Sexual Activity  . Alcohol use: Yes    Alcohol/week: 1.0 standard drinks  Types: 1 Glasses of wine per week    Comment: occ  . Drug use: No  . Sexual activity: Yes    Birth control/protection: Condom  Lifestyle  . Physical activity:    Days per week: Not on file    Minutes per session: Not on file  . Stress: Not on file  Relationships  . Social connections:    Talks on phone: Not on file    Gets together: Not on file    Attends religious service: Not on file    Active member of club or organization: Not on file    Attends meetings of clubs or organizations: Not on file    Relationship status: Not on file  . Intimate partner violence:    Fear of current or ex partner: Not on file    Emotionally abused: Not on file    Physically abused: Not on file    Forced sexual activity: Not on file  Other Topics Concern  . Not on file  Social History Narrative  . Not on file      Constitutional: Denies fever, malaise, fatigue, headache or abrupt weight changes.  Respiratory: Denies difficulty breathing, shortness of breath, cough or sputum production.   Cardiovascular: Denies chest pain, chest tightness, palpitations or swelling in the hands or feet.  Gastrointestinal: Pt reports diarrhea, hemorrhoids, rectal pain and itching. Denies abdominal pain, bloating, constipation, or blood in the stool.  GU: Denies urgency, frequency, pain with urination, burning sensation, blood in urine, odor or discharge. Musculoskeletal: Pt reports low back pain. Denies decrease in range of motion, difficulty with gait, muscle pain or joint swelling.  Skin: Denies redness, rashes, lesions or ulcercations.   No other specific complaints in a complete review of systems (except as listed in HPI above).  Objective:   Physical Exam  BP 106/64   Pulse 69   Temp 98.6 F (37 C) (Oral)   Wt 134 lb (60.8 kg)   SpO2 98%   BMI 21.79 kg/m  Wt Readings from Last 3 Encounters:  10/26/17 134 lb (60.8 kg)  06/18/17 135 lb 8 oz (61.5 kg)  05/21/17 134 lb (60.8 kg)    General: Appears her stated age, well developed, well nourished in NAD. Cardiovascular: Normal rate and rhythm. S1,S2 noted.  No murmur, rubs or gallops noted.  Pulmonary/Chest: Normal effort and positive vesicular breath sounds. No respiratory distress. No wheezes, rales or ronchi noted.  Abdomen: Soft and nontender. Normal bowel sounds. No distention or masses noted.  Rectal: Deferred, hemorrhoids have been chronic issue for her. Musculoskeletal: Decreased flexion, extension and rotation of the spine due to pain. Bony tenderness noted over the lumbar/sacral area. Pain with palpation of bilateral paralumbar muscles. Strength 5/5 BLE.  Neurological: Alert and oriented.    BMET    Component Value Date/Time   NA 139 06/18/2017 1102   K 3.9 06/18/2017 1102   CL 103 06/18/2017 1102   CO2 30 06/18/2017 1102   GLUCOSE 87  06/18/2017 1102   BUN 10 06/18/2017 1102   CREATININE 0.64 06/18/2017 1102   CREATININE 0.81 05/30/2012 0859   CALCIUM 9.1 06/18/2017 1102    Lipid Panel     Component Value Date/Time   CHOL 143 06/18/2017 1102   TRIG 55.0 06/18/2017 1102   HDL 56.10 06/18/2017 1102   CHOLHDL 3 06/18/2017 1102   VLDL 11.0 06/18/2017 1102   LDLCALC 76 06/18/2017 1102    CBC    Component Value Date/Time   WBC  8.2 06/18/2017 1102   RBC 4.26 06/18/2017 1102   HGB 13.2 06/18/2017 1102   HCT 38.9 06/18/2017 1102   PLT 256.0 06/18/2017 1102   MCV 91.3 06/18/2017 1102   MCH 28.7 05/30/2012 0859   MCHC 34.0 06/18/2017 1102   RDW 13.0 06/18/2017 1102    Hgb A1C No results found for: HGBA1C          Assessment & Plan:   Diarrhea, Hemorrhoids:  eRx for Hydrocortisone Suppositories 25 mg BID x 1 week Continue high fiber diet  Acute Low Back Pain without Sciatica:  eRx for Ibuprofen 800 mg TID with food eRx for Flexeril 5 mg TID prn- sedation caution given Heat and massage may be helpful Stretching exercises given  Return precautions discussed Nicki Reaper, NP

## 2017-10-26 NOTE — Patient Instructions (Signed)
Hemorrhoids    Hemorrhoids are swollen veins in and around the rectum or anus. Hemorrhoids can cause pain, itching, or bleeding. Most of the time, they do not cause serious problems. They usually get better with diet changes, lifestyle changes, and other home treatments.  Follow these instructions at home:  Eating and drinking  · Eat foods that have fiber, such as whole grains, beans, nuts, fruits, and vegetables. Ask your doctor about taking products that have added fiber (fiber supplements).  · Drink enough fluid to keep your pee (urine) clear or pale yellow.  For Pain and Swelling  · Take a warm-water bath (sitz bath) for 20 minutes to ease pain. Do this 3-4 times a day.  · If directed, put ice on the painful area. It may be helpful to use ice between your warm baths.  ¨ Put ice in a plastic bag.  ¨ Place a towel between your skin and the bag.  ¨ Leave the ice on for 20 minutes, 2-3 times a day.  General instructions  · Take over-the-counter and prescription medicines only as told by your doctor.  ¨ Medicated creams and medicines that are inserted into the anus (suppositories) may be used or applied as told.  · Exercise often.  · Go to the bathroom when you have the urge to poop (to have a bowel movement). Do not wait.  · Avoid pushing too hard (straining) when you poop.  · Keep the butt area dry and clean. Use wet toilet paper or moist paper towels.  · Do not sit on the toilet for a long time.  Contact a doctor if:  · You have any of these:  ¨ Pain and swelling that do not get better with treatment or medicine.  ¨ Bleeding that will not stop.  ¨ Trouble pooping or you cannot poop.  ¨ Pain or swelling outside the area of the hemorrhoids.  This information is not intended to replace advice given to you by your health care provider. Make sure you discuss any questions you have with your health care provider.  Document Released: 10/05/2007 Document Revised: 06/03/2015 Document Reviewed: 09/09/2014  Elsevier  Interactive Patient Education © 2018 Elsevier Inc.   

## 2017-10-30 ENCOUNTER — Ambulatory Visit: Payer: BLUE CROSS/BLUE SHIELD

## 2017-11-06 ENCOUNTER — Telehealth: Payer: Self-pay

## 2017-11-06 DIAGNOSIS — K649 Unspecified hemorrhoids: Secondary | ICD-10-CM

## 2017-11-06 NOTE — Telephone Encounter (Signed)
Pt seen on 10/26/17.

## 2017-11-06 NOTE — Telephone Encounter (Signed)
Dr. Leone Payor, [facility]. Let me know if she needs referral.

## 2017-11-06 NOTE — Telephone Encounter (Signed)
Copied from CRM [id number]. Topic: General - Other >> Nov 06, 2017 10:56 AM Percival Spanish wrote:  Pt call to ask  who Breanna Tran would recommend for hemorrhoid banding

## 2017-11-07 ENCOUNTER — Other Ambulatory Visit: Payer: Self-pay | Admitting: Internal Medicine

## 2017-11-07 DIAGNOSIS — Z1231 Encounter for screening mammogram for malignant neoplasm of breast: Secondary | ICD-10-CM

## 2017-11-07 NOTE — Telephone Encounter (Signed)
Referral placed.

## 2017-11-07 NOTE — Addendum Note (Signed)
Addended by: Lorre Munroe on: 11/07/2017 01:07 PM   Modules accepted: Orders

## 2017-11-07 NOTE — Telephone Encounter (Signed)
Pt is okay going to [facility] and would like referral

## 2017-11-13 ENCOUNTER — Encounter: Payer: Self-pay | Admitting: Internal Medicine

## 2017-11-20 ENCOUNTER — Ambulatory Visit
Admission: RE | Admit: 2017-11-20 | Discharge: 2017-11-20 | Disposition: A | Discharge status: Home / Self Care | Payer: BLUE CROSS/BLUE SHIELD | Source: Ambulatory Visit | Attending: Diagnostic Radiology | Admitting: Diagnostic Radiology

## 2017-11-20 ENCOUNTER — Other Ambulatory Visit: Payer: Self-pay | Admitting: Internal Medicine

## 2017-11-20 DIAGNOSIS — Z1231 Encounter for screening mammogram for malignant neoplasm of breast: Secondary | ICD-10-CM

## 2017-12-20 ENCOUNTER — Ambulatory Visit: Payer: BLUE CROSS/BLUE SHIELD | Admitting: Internal Medicine

## 2017-12-24 ENCOUNTER — Encounter: Payer: Self-pay | Admitting: Internal Medicine

## 2017-12-24 MED ORDER — HYDROXYZINE HCL 10 MG PO TABS: 10.0000 mg | ORAL_TABLET | Freq: Every day | ORAL | 0 refills | Status: DC | PRN

## 2018-01-15 ENCOUNTER — Other Ambulatory Visit: Payer: Self-pay | Admitting: Internal Medicine

## 2018-01-17 NOTE — Telephone Encounter (Signed)
Last filled 12/24/2017--- please advise if okay to refill

## 2019-02-18 ENCOUNTER — Encounter: Payer: Self-pay | Admitting: Family Medicine

## 2019-02-18 ENCOUNTER — Ambulatory Visit (INDEPENDENT_AMBULATORY_CARE_PROVIDER_SITE_OTHER): Payer: 59 | Admitting: Family Medicine

## 2019-02-18 VITALS — Temp 97.0°F | Wt 165.0 lb

## 2019-02-18 DIAGNOSIS — U071 COVID-19: Secondary | ICD-10-CM | POA: Diagnosis not present

## 2019-02-18 DIAGNOSIS — J0140 Acute pansinusitis, unspecified: Secondary | ICD-10-CM | POA: Diagnosis not present

## 2019-02-18 MED ORDER — DOXYCYCLINE HYCLATE 100 MG PO TABS: 100.0000 mg | ORAL_TABLET | Freq: Two times a day (BID) | ORAL | 0 refills | Status: AC

## 2019-02-18 NOTE — Progress Notes (Signed)
I connected with Gerald Stabs on 02/18/19 at  4:20 PM EST by video and verified that I am speaking with the correct person using two identifiers.   I discussed the limitations, risks, security and privacy concerns of performing an evaluation and management service by video and the availability of in person appointments. I also discussed with the patient that there may be a patient responsible charge related to this service. The patient expressed understanding and agreed to proceed.  Patient location: Home Provider Location: [facility] Participants: Lesleigh Noe and Gerald Stabs   Subjective:     Breanna Tran is a 49 y.o. female presenting for Nasal Congestion (tested positive for COVID 2 weeks ago. better. but having sinus issues-nasal congestion.)     Sinus Problem This is a new problem. The current episode started 1 to 4 weeks ago. There has been no fever. Associated symptoms include congestion, diaphoresis, ear pain, headaches and sinus pressure. Pertinent negatives include no shortness of breath or sore throat. Treatments tried: mucinex, pseudoephedrine, dayquil/nyquil. The treatment provided mild relief.     Just picked up a saline rinse Pressure and stuffy sensation is slowly getting better Worried about sinus infection/ear infection   Review of Systems  Constitutional: Positive for diaphoresis.  HENT: Positive for congestion, ear pain and sinus pressure. Negative for sore throat.   Respiratory: Negative for shortness of breath.   Neurological: Positive for headaches.     Social History   Tobacco Use  Smoking Status Former Smoker  . Packs/day: 0.50  . Years: 15.00  . Pack years: 7.50  . Types: E-cigarettes  . Quit date: 01/10/2012  . Years since quitting: 7.1  Smokeless Tobacco Never Used        Objective:   BP Readings from Last 3 Encounters:  10/26/17 106/64  06/18/17 102/60  05/21/17 106/74   Wt Readings from Last 3  Encounters:  02/18/19 165 lb (74.8 kg)  10/26/17 134 lb (60.8 kg)  06/18/17 135 lb 8 oz (61.5 kg)    Temp (!) 97 F (36.1 C) Comment: per patient  Wt 165 lb (74.8 kg) Comment: per patient  LMP 01/24/2019   BMI 26.83 kg/m    Physical Exam Constitutional:      Appearance: Normal appearance. She is not ill-appearing.  HENT:     Head: Normocephalic and atraumatic.     Right Ear: External ear normal.     Left Ear: External ear normal.  Eyes:     Conjunctiva/sclera: Conjunctivae normal.  Pulmonary:     Effort: Pulmonary effort is normal. No respiratory distress.  Neurological:     Mental Status: She is alert. Mental status is at baseline.  Psychiatric:        Mood and Affect: Mood normal.        Behavior: Behavior normal.        Thought Content: Thought content normal.        Judgment: Judgment normal.            Assessment & Plan:   Problem List Items Addressed This Visit    None    Visit Diagnoses    Acute non-recurrent pansinusitis    -  Primary   Relevant Medications   doxycycline (VIBRA-TABS) 100 MG tablet   COVID-19 virus infection         Discussed the covid course can be typical for 3 weeks and occasionally longer. Could watch and wait and if fevers/worsening symptoms -  start Abx.   Encouraged saline rinse.   Return if symptoms worsen or fail to improve.  Lynnda Child, MD

## 2019-05-06 ENCOUNTER — Other Ambulatory Visit: Payer: Self-pay | Admitting: Internal Medicine

## 2019-05-06 DIAGNOSIS — Z1231 Encounter for screening mammogram for malignant neoplasm of breast: Secondary | ICD-10-CM

## 2019-05-07 ENCOUNTER — Ambulatory Visit: Payer: 59 | Admitting: Internal Medicine

## 2019-05-07 ENCOUNTER — Encounter: Payer: Self-pay | Admitting: Internal Medicine

## 2019-05-07 ENCOUNTER — Other Ambulatory Visit: Payer: Self-pay

## 2019-05-07 VITALS — BP 118/72 | HR 62 | Temp 98.2°F | Wt 167.0 lb

## 2019-05-07 DIAGNOSIS — N952 Postmenopausal atrophic vaginitis: Secondary | ICD-10-CM

## 2019-05-07 DIAGNOSIS — K649 Unspecified hemorrhoids: Secondary | ICD-10-CM | POA: Diagnosis not present

## 2019-05-07 MED ORDER — HYDROCORTISONE ACETATE 25 MG RE SUPP: 25.0000 mg | Freq: Two times a day (BID) | RECTAL | 1 refills | Status: DC

## 2019-05-07 MED ORDER — ESTROGENS, CONJUGATED 0.625 MG/GM VA CREA: 1.0000 | TOPICAL_CREAM | VAGINAL | 12 refills | Status: DC

## 2019-05-07 NOTE — Patient Instructions (Signed)
Atrophic Vaginitis Atrophic vaginitis is a condition in which the tissues that line the vagina become dry and thin. This condition occurs in women who have stopped having their period. It is caused by a drop in a female hormone (estrogen). This hormone helps:  To keep the vagina moist.  To make a clear fluid. This clear fluid helps: ? To make the vagina ready for sex. ? To protect the vagina from infection. If the lining of the vagina is dry and thin, it may cause irritation, burning, or itchiness. It may also:  Make sex painful.  Make an exam of your vagina painful.  Cause bleeding.  Make you lose interest in sex.  Cause a burning feeling when you pee (urinate).  Cause a brown or yellow fluid to come from your vagina. Some women do not have symptoms. Follow these instructions at home: Medicines  Take over-the-counter and prescription medicines only as told by your doctor.  Do not use herbs or other medicines unless your doctor says it is okay.  Use medicines for for dryness. These include: ? Oils to make the vagina soft. ? Creams. ? Moisturizers. General instructions  Do not douche.  Do not use products that can make your vagina dry. These include: ? Scented sprays. ? Scented tampons. ? Scented soaps.  Sex can help increase blood flow and soften the tissue in the vagina. If it hurts to have sex: ? Tell your partner. ? Use products to make sex more comfortable. Use these only as told by your doctor. Contact a doctor if you:  Have discharge from the vagina that is different than usual.  Have a bad smell coming from your vagina.  Have new symptoms.  Do not get better.  Get worse. Summary  Atrophic vaginitis is a condition in which the lining of the vagina becomes dry and thin.  This condition affects women who have stopped having their periods.  Treatment may include using products that help make the vagina soft.  Call a doctor if do not get better with  treatment. This information is not intended to replace advice given to you by your health care provider. Make sure you discuss any questions you have with your health care provider. Document Revised: 01/08/2017 Document Reviewed: 01/08/2017 Elsevier Patient Education  2020 Elsevier Inc.  

## 2019-05-07 NOTE — Progress Notes (Signed)
Subjective:    Patient ID: Breanna Tran, female    DOB: 1970/02/01, 49 y.o.   MRN: [id number]  HPI  Patient presents to the clinic today with complaint of hemorrhoids. She reports rectal swelling, itching and burning for the last 2 weeks. She has not noticed any bleeding. She reports she has been taking Magnesium to make sure she does not have constipation. She has not been sitting for long periods of time. She has tried Preparation H OTC with minimal relief. She would like a refill of Anusol. She has made an appt with GI for further evaluation.  Review of Systems      Past Medical History:  Diagnosis Date  . Genital warts due to HPV (human papillomavirus) 2000   wart removed.   . Gestational diabetes    2 nd pregnancy only  . PONV (postoperative nausea and vomiting)   . Strep throat    10/18    Current Outpatient Medications  Medication Sig Dispense Refill  . docusate sodium (COLACE) 100 MG capsule Take 200 mg by mouth at bedtime.    . hydrocortisone (ANUSOL-HC) 25 MG suppository Place 1 suppository (25 mg total) rectally 2 (two) times daily. 14 suppository 1  . ibuprofen (ADVIL,MOTRIN) 800 MG tablet Take 1 tablet (800 mg total) by mouth every 8 (eight) hours as needed. 30 tablet 0   No current facility-administered medications for this visit.    Allergies  Allergen Reactions  . Augmentin [Amoxicillin-Pot Clavulanate] Diarrhea    Family History  Problem Relation Age of Onset  . Cancer Father        renal  . Cancer Maternal Grandfather        lung  . Graves' disease Mother   . Brain cancer Maternal Uncle   . Breast cancer Neg Hx     Social History   Socioeconomic History  . Marital status: Married    Spouse name: Not on file  . Number of children: Not on file  . Years of education: Not on file  . Highest education level: Not on file  Occupational History  . Not on file  Tobacco Use  . Smoking status: Former Smoker    Packs/day: 0.50    Years: 15.00      Pack years: 7.50    Types: E-cigarettes    Quit date: 01/10/2012    Years since quitting: 7.3  . Smokeless tobacco: Never Used  Substance and Sexual Activity  . Alcohol use: Yes    Alcohol/week: 1.0 standard drinks    Types: 1 Glasses of wine per week    Comment: occ  . Drug use: No  . Sexual activity: Yes    Birth control/protection: Condom  Other Topics Concern  . Not on file  Social History Narrative  . Not on file   Social Determinants of Health   Financial Resource Strain:   . Difficulty of Paying Living Expenses:   Food Insecurity:   . Worried About Charity fundraiser in the Last Year:   . Arboriculturist in the Last Year:   Transportation Needs:   . Film/video editor (Medical):   Marland Kitchen Lack of Transportation (Non-Medical):   Physical Activity:   . Days of Exercise per Week:   . Minutes of Exercise per Session:   Stress:   . Feeling of Stress :   Social Connections:   . Frequency of Communication with Friends and Family:   . Frequency of Social Gatherings with  Friends and Family:   . Attends Religious Services:   . Active Member of Clubs or Organizations:   . Attends Banker Meetings:   Marland Kitchen Marital Status:   Intimate Partner Violence:   . Fear of Current or Ex-Partner:   . Emotionally Abused:   Marland Kitchen Physically Abused:   . Sexually Abused:      Constitutional: Denies fever, malaise, fatigue, headache or abrupt weight changes.  Respiratory: Denies difficulty breathing, shortness of breath, cough or sputum production.   Cardiovascular: Denies chest pain, chest tightness, palpitations or swelling in the hands or feet.  Gastrointestinal: Pt reports hemorrhoids. Denies abdominal pain, bloating, constipation, diarrhea or blood in the stool.  GU: Pt reports vaginal irritation and dryness. Denies urgency, frequency, dysuria or blood in her urine.   No other specific complaints in a complete review of systems (except as listed in HPI  above).  Objective:   Physical Exam  BP 118/72   Pulse 62   Temp 98.2 F (36.8 C) (Temporal)   Wt 167 lb (75.8 kg)   SpO2 98%   BMI 27.16 kg/m    Wt Readings from Last 3 Encounters:  02/18/19 165 lb (74.8 kg)  10/26/17 134 lb (60.8 kg)  06/18/17 135 lb 8 oz (61.5 kg)    General: Appears her stated age, well developed, well nourished in NAD. Skin: Warm, dry and intact. No rashes, lesions or ulcerations noted. Abdomen: Soft. Pelvic: Normal female anatomy. Vaginal opening dry. No discharge or odor present. No rash or lesion noted. Rectal: External anal tag/hemorrhoid noted. Neurological: Alert and oriented.   BMET    Component Value Date/Time   NA 139 06/18/2017 1102   K 3.9 06/18/2017 1102   CL 103 06/18/2017 1102   CO2 30 06/18/2017 1102   GLUCOSE 87 06/18/2017 1102   BUN 10 06/18/2017 1102   CREATININE 0.64 06/18/2017 1102   CREATININE 0.81 05/30/2012 0859   CALCIUM 9.1 06/18/2017 1102    Lipid Panel     Component Value Date/Time   CHOL 143 06/18/2017 1102   TRIG 55.0 06/18/2017 1102   HDL 56.10 06/18/2017 1102   CHOLHDL 3 06/18/2017 1102   VLDL 11.0 06/18/2017 1102   LDLCALC 76 06/18/2017 1102    CBC    Component Value Date/Time   WBC 8.2 06/18/2017 1102   RBC 4.26 06/18/2017 1102   HGB 13.2 06/18/2017 1102   HCT 38.9 06/18/2017 1102   PLT 256.0 06/18/2017 1102   MCV 91.3 06/18/2017 1102   MCH 28.7 05/30/2012 0859   MCHC 34.0 06/18/2017 1102   RDW 13.0 06/18/2017 1102    Hgb A1C No results found for: HGBA1C         Assessment & Plan:  Hemorrhoids:  Refilled Anusol Follow up with GI as scheduled  Menopausal Atrophic Vaginitis:  RX for Premarin cream 2 x week  Return precautions discussed Nicki Reaper, NP This visit occurred during the SARS-CoV-2 public health emergency.  Safety protocols were in place, including screening questions prior to the visit, additional usage of staff PPE, and extensive cleaning of exam room while  observing appropriate contact time as indicated for disinfecting solutions.

## 2019-05-21 ENCOUNTER — Other Ambulatory Visit: Payer: Self-pay

## 2019-05-21 ENCOUNTER — Ambulatory Visit
Admission: RE | Admit: 2019-05-21 | Discharge: 2019-05-21 | Disposition: A | Discharge status: Home / Self Care | Payer: 59 | Source: Ambulatory Visit | Attending: Internal Medicine | Admitting: Internal Medicine

## 2019-05-21 DIAGNOSIS — Z1231 Encounter for screening mammogram for malignant neoplasm of breast: Secondary | ICD-10-CM

## 2019-11-18 ENCOUNTER — Encounter: Payer: Self-pay | Admitting: Family Medicine

## 2019-11-18 ENCOUNTER — Other Ambulatory Visit: Payer: Self-pay

## 2019-11-18 ENCOUNTER — Ambulatory Visit: Payer: 59 | Admitting: Family Medicine

## 2019-11-18 DIAGNOSIS — M545 Low back pain, unspecified: Secondary | ICD-10-CM | POA: Insufficient documentation

## 2019-11-18 DIAGNOSIS — M5442 Lumbago with sciatica, left side: Secondary | ICD-10-CM

## 2019-11-18 MED ORDER — CYCLOBENZAPRINE HCL 5 MG PO TABS: 5.0000 mg | ORAL_TABLET | Freq: Three times a day (TID) | ORAL | 0 refills | Status: DC | PRN

## 2019-11-18 NOTE — Patient Instructions (Addendum)
Continue ibuprofen 800 mg as needed up to three times daily with meals  Try generic flexeril -muscle relaxer with caution of sedation   Our office will call regarding PT   Continue stretches and look at the hand out   Think about a foam top for your mattress    If symptoms suddenly worsen please call and let us know

## 2019-11-18 NOTE — Progress Notes (Signed)
Subjective:    Patient ID: Breanna Tran, female    DOB: 1970/09/27, 49 y.o.   MRN: [id number]  This visit occurred during the SARS-CoV-2 public health emergency.  Safety protocols were in place, including screening questions prior to the visit, additional usage of staff PPE, and extensive cleaning of exam room while observing appropriate contact time as indicated for disinfecting solutions.    HPI 49 yo pt of NP Baity presents with flare up of sciatica   Wt Readings from Last 3 Encounters:  11/18/19 168 lb 4 oz (76.3 kg)  05/07/19 167 lb (75.8 kg)  02/18/19 165 lb (74.8 kg)   27.36 kg/m  Has a new mattress- may be too firm  Also sitting too much sitting in car   Has had before  This episode started a week ago - was sitting , she got up and walked Did stretches Ice heat and ibuprofen (800 mg) == ice feels better  Now keeping her up   Left lumbar pain  Shoots down her leg  Spasm- whole back catches  Knee is starting to bother her  No focal weakness -  Feels apprehensive however  L foot - 2 toes feel numb off/on -- before this started   Uses tiger balm   Has taken flexeril in the past   Patient Active Problem List   Diagnosis Date Noted   Low back pain 11/18/2019   Psychophysiological insomnia 05/21/2017   Anxiety 05/21/2017   Menopausal symptoms 05/21/2017   Past Medical History:  Diagnosis Date   Genital warts due to HPV (human papillomavirus) 2000   wart removed.    Gestational diabetes    2 nd pregnancy only   PONV (postoperative nausea and vomiting)    Strep throat    10/18   Past Surgical History:  Procedure Laterality Date   APPENDECTOMY  1993   COSMETIC SURGERY Bilateral    ears   PILONIDAL CYST EXCISION     SPHINCTEROTOMY N/A 12/15/2016   Procedure: CHEMICAL SPHINCTEROTOMY (BOTOX);  Surgeon: Romie Levee, MD;  Location: [facility];  Service: General;  Laterality: N/A;   WISDOM TOOTH EXTRACTION  1995   Social  History   Tobacco Use   Smoking status: Former Smoker    Packs/day: 0.50    Years: 15.00    Pack years: 7.50    Types: E-cigarettes    Quit date: 01/10/2012    Years since quitting: 7.8   Smokeless tobacco: Never Used  Vaping Use   Vaping Use: Some days  Substance Use Topics   Alcohol use: Yes    Alcohol/week: 1.0 standard drink    Types: 1 Glasses of wine per week    Comment: occ   Drug use: No   Family History  Problem Relation Age of Onset   Cancer Father        renal   Cancer Maternal Grandfather        lung   Graves' disease Mother    Brain cancer Maternal Uncle    Breast cancer Neg Hx    Allergies  Allergen Reactions   Augmentin [Amoxicillin-Pot Clavulanate] Diarrhea   Current Outpatient Medications on File Prior to Visit  Medication Sig Dispense Refill   ibuprofen (ADVIL) 200 MG tablet Take 200 mg by mouth every 6 (six) hours as needed.     naproxen sodium (ALEVE) 220 MG tablet Take 220 mg by mouth.     polyethylene glycol (MIRALAX / GLYCOLAX) 17 g packet Take  17 g by mouth daily.     Vitamin D, Cholecalciferol, 50 MCG (2000 UT) CAPS Take by mouth.     No current facility-administered medications on file prior to visit.     Review of Systems  Constitutional: Negative for activity change, appetite change, fatigue, fever and unexpected weight change.  HENT: Negative for congestion, ear pain, rhinorrhea, sinus pressure and sore throat.   Eyes: Negative for pain, redness and visual disturbance.  Respiratory: Negative for cough, shortness of breath and wheezing.   Cardiovascular: Negative for chest pain and palpitations.  Gastrointestinal: Negative for abdominal pain, blood in stool, constipation and diarrhea.  Endocrine: Negative for polydipsia and polyuria.  Genitourinary: Negative for dysuria, frequency and urgency.  Musculoskeletal: Positive for back pain. Negative for arthralgias and myalgias.  Skin: Negative for pallor and rash.    Allergic/Immunologic: Negative for environmental allergies.  Neurological: Negative for dizziness, syncope and headaches.  Hematological: Negative for adenopathy. Does not bruise/bleed easily.  Psychiatric/Behavioral: Negative for decreased concentration and dysphoric mood. The patient is not nervous/anxious.        Objective:   Physical Exam Constitutional:      General: She is not in acute distress.    Appearance: Normal appearance. She is well-developed and normal weight.  Eyes:     General: No scleral icterus.    Conjunctiva/sclera: Conjunctivae normal.     Pupils: Pupils are equal, round, and reactive to light.  Cardiovascular:     Rate and Rhythm: Normal rate and regular rhythm.  Pulmonary:     Effort: Pulmonary effort is normal.  Musculoskeletal:        General: Tenderness present.     Cervical back: Normal range of motion and neck supple.     Lumbar back: Spasms and tenderness present. No swelling, edema or bony tenderness. Decreased range of motion.     Comments: Tender in L lumbar musculature and piriformis  Hurts to ext rotate hip  Flex LS 90 deg Ext 5 deg-discomfort  R lat bend-discomfort  No bony tenderness  SLR bilat cause back (not leg) pain  Nl gait  No foot drop   Lymphadenopathy:     Cervical: No cervical adenopathy.  Skin:    General: Skin is warm and dry.     Coloration: Skin is not pale.     Findings: No erythema or rash.  Neurological:     Mental Status: She is alert.     Cranial Nerves: No cranial nerve deficit.     Sensory: No sensory deficit.     Motor: No atrophy or abnormal muscle tone.     Coordination: Coordination normal.     Deep Tendon Reflexes: Reflexes are normal and symmetric. Reflexes normal.     Comments: Negative SLR  Psychiatric:        Mood and Affect: Mood normal.           Assessment & Plan:   Problem List Items Addressed This Visit      Other   Low back pain    Acute / recurrent  Ref made to PT No concerning  neuro findings  Adv to keep walking /continue ibuprofen Sent flexeril to use prn with caution of sedation  Enc use of heat and continued stretching (handout given)  Consider imaging if no imp inst to alert [place] if worse or no improvement       Relevant Medications   cyclobenzaprine (FLEXERIL) 5 MG tablet   Other Relevant Orders   Ambulatory referral  to Physical Therapy

## 2019-11-18 NOTE — Assessment & Plan Note (Signed)
Acute / recurrent  Ref made to PT No concerning neuro findings  Adv to keep walking /continue ibuprofen Sent flexeril to use prn with caution of sedation  Enc use of heat and continued stretching (handout given)  Consider imaging if no imp inst to alert [place] if worse or no improvement

## 2020-01-22 ENCOUNTER — Other Ambulatory Visit: Payer: Self-pay

## 2020-01-22 ENCOUNTER — Encounter: Payer: Self-pay | Admitting: Obstetrics and Gynecology

## 2020-01-22 ENCOUNTER — Ambulatory Visit (INDEPENDENT_AMBULATORY_CARE_PROVIDER_SITE_OTHER): Payer: 59 | Admitting: Obstetrics and Gynecology

## 2020-01-22 VITALS — BP 115/70 | HR 80 | Ht 67.0 in | Wt 160.4 lb

## 2020-01-22 DIAGNOSIS — N951 Menopausal and female climacteric states: Secondary | ICD-10-CM

## 2020-01-22 DIAGNOSIS — F419 Anxiety disorder, unspecified: Secondary | ICD-10-CM

## 2020-01-22 DIAGNOSIS — F32A Depression, unspecified: Secondary | ICD-10-CM | POA: Diagnosis not present

## 2020-01-22 LAB — CBC
Hematocrit: 42.7 % (ref 34.0–46.6)
Hemoglobin: 14.6 g/dL (ref 11.1–15.9)
MCH: 30.4 pg (ref 26.6–33.0)
MCHC: 34.2 g/dL (ref 31.5–35.7)
MCV: 89 fL (ref 79–97)
Platelets: 264 10*3/uL (ref 150–450)
RBC: 4.81 x10E6/uL (ref 3.77–5.28)
RDW: 12.3 % (ref 11.7–15.4)
WBC: 8.2 10*3/uL (ref 3.4–10.8)

## 2020-01-22 NOTE — Progress Notes (Signed)
Obstetrics and Gynecology New Patient Evaluation  Appointment Date: 01/22/2020  OBGYN Clinic: Center for [facility]  Primary Care Provider: Lorre Munroe  Referring Provider: Lorre Munroe, NP  Chief Complaint: Anxiety/depression  History of Present Illness: Breanna Tran is a 50 y.o. Caucasian G4P1020 (No LMP recorded. (Menstrual status: Perimenopausal).), seen for the above chief complaint.   Patient with increasing anxiety and depression s/s since the pandemic and increasing lately. No thoughts of HI or SI. Pt was tried on effexor in the past, to also help with night sweats by her PCP, but with no relief. Her husband was on lexapro in the past and she is wondering about this.   Review of Systems: Pertinent items noted in HPI and remainder of comprehensive ROS otherwise negative.   Patient Active Problem List   Diagnosis Date Noted  . Low back pain 11/18/2019  . Psychophysiological insomnia 05/21/2017  . Anxiety 05/21/2017  . Perimenopausal 05/21/2017    Past Medical History:  Past Medical History:  Diagnosis Date  . Genital warts due to HPV (human papillomavirus) 2000   wart removed.   . Gestational diabetes    2 nd pregnancy only  . PONV (postoperative nausea and vomiting)   . Strep throat    10/18    Past Surgical History:  Past Surgical History:  Procedure Laterality Date  . APPENDECTOMY  1993  . COSMETIC SURGERY Bilateral    ears  . PILONIDAL CYST EXCISION    . SPHINCTEROTOMY N/A 12/15/2016   Procedure: CHEMICAL SPHINCTEROTOMY (BOTOX);  Surgeon: Romie Levee, MD;  Location: [facility];  Service: General;  Laterality: N/A;  . WISDOM TOOTH EXTRACTION  1995    Past Obstetrical History:  OB History  Gravida Para Term Preterm AB Living  4 2 1   2     SAB IAB Ectopic Multiple Live Births  2            # Outcome Date GA Lbr Len/2nd Weight Sex Delivery Anes PTL Lv  4 SAB 04/06/12          3 SAB 11/09/10          2  Para 04/13/09 [redacted]w[redacted]d   F Vag-Spont     1 Term 05/27/04 [redacted]w[redacted]d   F Vag-Vacuum       Past Gynecological History: As per HPI. LMP: October 2021 3 days, not heavy or painful and had one early 2021.  +night sweats. No vaginal s/s or dyspareunia History of Pap Smear(s): Yes.   Last pap 2019, which was negative History of HRT use: No.  Social History:  Social History   Socioeconomic History  . Marital status: Married    Spouse name: Not on file  . Number of children: Not on file  . Years of education: Not on file  . Highest education level: Not on file  Occupational History  . Not on file  Tobacco Use  . Smoking status: Former Smoker    Packs/day: 0.50    Years: 15.00    Pack years: 7.50    Types: E-cigarettes    Quit date: 01/10/2012    Years since quitting: 8.0  . Smokeless tobacco: Never Used  Vaping Use  . Vaping Use: Some days  Substance and Sexual Activity  . Alcohol use: Yes    Alcohol/week: 1.0 standard drink    Types: 1 Glasses of wine per week    Comment: occ  . Drug use: No  . Sexual activity: Yes  Birth control/protection: Condom  Other Topics Concern  . Not on file  Social History Narrative  . Not on file   Social Determinants of Health   Financial Resource Strain: Not on file  Food Insecurity: Not on file  Transportation Needs: Not on file  Physical Activity: Not on file  Stress: Not on file  Social Connections: Not on file  Intimate Partner Violence: Not on file    Family History:  Family History  Problem Relation Age of Onset  . Cancer Father        renal  . Cancer Maternal Grandfather        lung  . Graves' disease Mother   . Brain cancer Maternal Uncle   . Breast cancer Neg Hx    She denies any female cancers  Health Maintenance:  Mammogram(s): Yes.   Date: 2021, negative  Medications Breanna Tran had no medications administered during this visit. Current Outpatient Medications  Medication Sig Dispense Refill  . HORSE CHESTNUT  PO Take by mouth.    Marland Kitchen ibuprofen (ADVIL) 200 MG tablet Take 200 mg by mouth every 6 (six) hours as needed.    . Misc Natural Products (BUTCHERS BROOM PO) Take by mouth.    . Vitamin D, Cholecalciferol, 50 MCG (2000 UT) CAPS Take by mouth.    . Wheat Dextrin (BENEFIBER DRINK MIX PO) Take by mouth.    . zinc gluconate 50 MG tablet Take 50 mg by mouth in the morning and at bedtime.    . cyclobenzaprine (FLEXERIL) 5 MG tablet Take 1 tablet (5 mg total) by mouth 3 (three) times daily as needed for muscle spasms. Caution of sedation 20 tablet 0  . naproxen sodium (ALEVE) 220 MG tablet Take 220 mg by mouth.    . polyethylene glycol (MIRALAX / GLYCOLAX) 17 g packet Take 17 g by mouth daily.     No current facility-administered medications for this visit.    Allergies Augmentin [amoxicillin-pot clavulanate]   Physical Exam:  BP 115/70   Pulse 80   Ht 5\' 7"  (1.702 m)   Wt 160 lb 6.4 oz (72.8 kg)   BMI 25.12 kg/m  Body mass index is 25.12 kg/m. General appearance: Well nourished, well developed female in no acute distress.  Respiratory:   Normal respiratory effort Neuro/Psych:  Normal mood and affect.  Skin:  Warm and dry.   PHQ 9: 24, no thoughts of SI  Laboratory: none  Radiology: none  Assessment: pt stable  Plan:  1. Anxiety and depression Long discussion with patient re: treatment plan. She denies any h/o bipolar. I d/w her re: counseling/therapy and she is amenable to this, but she would also like to do a trial of medications as well. Risks with medications d/w her namely worsening s/s and to stop medications and let know and come into the hospital. Will check baseline labs and if negative then can do a trial of qday lexapro and prn visatril.  - TSH - CBC - Comprehensive metabolic panel - Ambulatory referral to Integrated Behavioral Health  2. Perimenopausal D/w her re: menopausal s/s to watch out for and a whole year of amenorrhea for official dx of menopause.   Orders  Placed This Encounter  Procedures  . TSH  . CBC  . Comprehensive metabolic panel  . Ambulatory referral to Integrated Behavioral Health    RTC 2-3wks  [place], [place] Bing MD Attending Center for [facility])

## 2020-01-22 NOTE — Progress Notes (Signed)
C/o depression, anxiety, lose of appetite, and lack of sleep  Pt states she had 2 periods in 2020, 10 months apart. PHQ-9: 24 GAD-7: 21

## 2020-01-23 LAB — COMPREHENSIVE METABOLIC PANEL
ALT: 15 IU/L (ref 0–32)
AST: 16 IU/L (ref 0–40)
Albumin/Globulin Ratio: 2.4 — ABNORMAL HIGH (ref 1.2–2.2)
Albumin: 3.9 g/dL (ref 3.8–4.8)
Alkaline Phosphatase: 38 IU/L — ABNORMAL LOW (ref 44–121)
BUN/Creatinine Ratio: 12 (ref 9–23)
BUN: 9 mg/dL (ref 6–24)
Bilirubin Total: 0.3 mg/dL (ref 0.0–1.2)
CO2: 24 mmol/L (ref 20–29)
Calcium: 9.1 mg/dL (ref 8.7–10.2)
Chloride: 106 mmol/L (ref 96–106)
Creatinine, Ser: 0.78 mg/dL (ref 0.57–1.00)
GFR calc Af Amer: 105 mL/min/{1.73_m2} (ref >59)
GFR calc non Af Amer: 91 mL/min/{1.73_m2} (ref >59)
Globulin, Total: 1.6 g/dL (ref 1.5–4.5)
Glucose: 97 mg/dL (ref 65–99)
Potassium: 4.4 mmol/L (ref 3.5–5.2)
Sodium: 142 mmol/L (ref 134–144)
Total Protein: 5.5 g/dL — ABNORMAL LOW (ref 6.0–8.5)

## 2020-01-23 LAB — TSH: TSH: 1.67 u[IU]/mL (ref 0.450–4.500)

## 2020-01-27 ENCOUNTER — Other Ambulatory Visit: Payer: Self-pay | Admitting: Obstetrics and Gynecology

## 2020-01-27 MED ORDER — HYDROXYZINE HCL 25 MG PO TABS: 25.0000 mg | ORAL_TABLET | Freq: Three times a day (TID) | ORAL | 0 refills | Status: DC | PRN

## 2020-01-27 MED ORDER — ESCITALOPRAM OXALATE 10 MG PO TABS: 10.0000 mg | ORAL_TABLET | Freq: Every day | ORAL | 1 refills | Status: DC

## 2020-02-27 ENCOUNTER — Ambulatory Visit (INDEPENDENT_AMBULATORY_CARE_PROVIDER_SITE_OTHER): Payer: 59 | Admitting: Family Medicine

## 2020-02-27 ENCOUNTER — Other Ambulatory Visit: Payer: Self-pay

## 2020-02-27 VITALS — BP 82/60 | HR 53 | Temp 97.4°F | Ht 67.0 in | Wt 160.0 lb

## 2020-02-27 DIAGNOSIS — R300 Dysuria: Secondary | ICD-10-CM | POA: Diagnosis not present

## 2020-02-27 LAB — POC URINALSYSI DIPSTICK (AUTOMATED)
Bilirubin, UA: NEGATIVE
Blood, UA: NEGATIVE
Glucose, UA: NEGATIVE
Leukocytes, UA: NEGATIVE
Nitrite, UA: NEGATIVE
Protein, UA: NEGATIVE
Spec Grav, UA: 1.01 (ref 1.010–1.025)
Urobilinogen, UA: 0.2 E.U./dL
pH, UA: 6 (ref 5.0–8.0)

## 2020-02-27 NOTE — Patient Instructions (Addendum)
Stop citrus and vit C .  Increase water daily.  Avoid Aleve or ibuprofen.  Call if still not improving as expected.

## 2020-02-27 NOTE — Assessment & Plan Note (Signed)
UA clear.  Likely bladder irritation.. possibly from citrus or holding urine.  Increase water, hold citris , avoids NSAIDs. Follow up if not improving as expected.

## 2020-02-27 NOTE — Progress Notes (Signed)
Patient ID: Breanna Tran, female    DOB: [birth year], 50 y.o.   MRN: [id number]  This visit was conducted in person.  BP (!) 82/60   Pulse (!) 53   Temp (!) 97.4 F (36.3 C) (Temporal)   Ht 5\' 7"  (1.702 m)   Wt 160 lb (72.6 kg)   SpO2 98%   BMI 25.06 kg/m    CC:  Chief Complaint  Patient presents with  . Dysuria    All symptoms x 1 week   . Urinary Frequency  . Urinary Urgency    Subjective:   HPI: Breanna Tran is a 50 y.o. female presenting on 02/27/2020 for Dysuria (All symptoms x 1 week ), Urinary Frequency, and Urinary Urgency     Has been drinking lots of citrus.  Dysuria  This is a new problem. The current episode started 1 to 4 weeks ago. The problem has been waxing and waning. The quality of the pain is described as burning. The pain is mild. There has been no fever. She is sexually active. There is no history of pyelonephritis. Associated symptoms include frequency, hematuria and urgency. Pertinent negatives include no chills, flank pain, nausea or possible pregnancy. Associated symptoms comments: Husband with vasectomy. history of UTI years ago.  Urinary Frequency  Associated symptoms include frequency, hematuria and urgency. Pertinent negatives include no chills, flank pain, nausea or possible pregnancy. history of UTI years ago.         Relevant past medical, surgical, family and social history reviewed and updated as indicated. Interim medical history since our last visit reviewed. Allergies and medications reviewed and updated. Outpatient Medications Prior to Visit  Medication Sig Dispense Refill  . escitalopram (LEXAPRO) 10 MG tablet Take 1 tablet (10 mg total) by mouth daily. 60 tablet 1  . ibuprofen (ADVIL) 200 MG tablet Take 200 mg by mouth every 6 (six) hours as needed.    . Magnesium Citrate POWD by Does not apply route daily at 12 noon.    . polyethylene glycol (MIRALAX / GLYCOLAX) 17 g packet Take 17 g by mouth daily.    . Vitamin D,  Cholecalciferol, 50 MCG (2000 UT) CAPS Take by mouth.    . zinc gluconate 50 MG tablet Take 50 mg by mouth in the morning and at bedtime.    . cyclobenzaprine (FLEXERIL) 5 MG tablet Take 1 tablet (5 mg total) by mouth 3 (three) times daily as needed for muscle spasms. Caution of sedation 20 tablet 0  . HORSE CHESTNUT PO Take by mouth.    . hydrOXYzine (ATARAX/VISTARIL) 25 MG tablet Take 1 tablet (25 mg total) by mouth every 8 (eight) hours as needed for anxiety. 30 tablet 0  . Misc Natural Products (BUTCHERS BROOM PO) Take by mouth.    . naproxen sodium (ALEVE) 220 MG tablet Take 220 mg by mouth.    . Wheat Dextrin (BENEFIBER DRINK MIX PO) Take by mouth.     No facility-administered medications prior to visit.     Per HPI unless specifically indicated in ROS section below Review of Systems  Constitutional: Negative for chills.  Gastrointestinal: Negative for nausea.  Genitourinary: Positive for dysuria, frequency, hematuria and urgency. Negative for flank pain.   Objective:  BP (!) 82/60   Pulse (!) 53   Temp (!) 97.4 F (36.3 C) (Temporal)   Ht 5\' 7"  (1.702 m)   Wt 160 lb (72.6 kg)   SpO2 98%   BMI 25.06 kg/m  Wt Readings from Last 3 Encounters:  02/27/20 160 lb (72.6 kg)  01/22/20 160 lb 6.4 oz (72.8 kg)  11/18/19 168 lb 4 oz (76.3 kg)      Physical Exam Constitutional:      General: She is not in acute distress.Vital signs are normal.     Appearance: Normal appearance. She is well-developed and well-nourished. She is not ill-appearing or toxic-appearing.  HENT:     Head: Normocephalic.     Right Ear: Hearing, tympanic membrane, ear canal and external ear normal. Tympanic membrane is not erythematous, retracted or bulging.     Left Ear: Hearing, tympanic membrane, ear canal and external ear normal. Tympanic membrane is not erythematous, retracted or bulging.     Nose: No mucosal edema or rhinorrhea.     Right Sinus: No maxillary sinus tenderness or frontal sinus  tenderness.     Left Sinus: No maxillary sinus tenderness or frontal sinus tenderness.     Mouth/Throat:     Mouth: Oropharynx is clear and moist and mucous membranes are normal.     Pharynx: Uvula midline.  Eyes:     General: Lids are normal. Lids are everted, no foreign bodies appreciated.     Extraocular Movements: EOM normal.     Conjunctiva/sclera: Conjunctivae normal.     Pupils: Pupils are equal, round, and reactive to light.  Neck:     Thyroid: No thyroid mass or thyromegaly.     Vascular: No carotid bruit.     Trachea: Trachea normal.  Cardiovascular:     Rate and Rhythm: Normal rate and regular rhythm.     Pulses: Normal pulses and intact distal pulses.     Heart sounds: Normal heart sounds, S1 normal and S2 normal. No murmur heard. No friction rub. No gallop.   Pulmonary:     Effort: Pulmonary effort is normal. No tachypnea or respiratory distress.     Breath sounds: Normal breath sounds. No decreased breath sounds, wheezing, rhonchi or rales.  Abdominal:     General: Bowel sounds are normal.     Palpations: Abdomen is soft.     Tenderness: There is no abdominal tenderness.  Musculoskeletal:     Cervical back: Normal range of motion and neck supple.  Skin:    General: Skin is warm, dry and intact.     Findings: No rash.  Neurological:     Mental Status: She is alert.  Psychiatric:        Mood and Affect: Mood is not anxious or depressed.        Speech: Speech normal.        Behavior: Behavior normal. Behavior is cooperative.        Thought Content: Thought content normal.        Cognition and Memory: Cognition and memory normal.        Judgment: Judgment normal.       Results for orders placed or performed in visit on 02/27/20  POCT Urinalysis Dipstick (Automated)  Result Value Ref Range   Color, UA lite yellow    Clarity, UA clear    Glucose, UA Negative Negative   Bilirubin, UA negative    Ketones, UA trace    Spec Grav, UA 1.010 1.010 - 1.025   Blood,  UA negative    pH, UA 6.0 5.0 - 8.0   Protein, UA Negative Negative   Urobilinogen, UA 0.2 0.2 or 1.0 E.U./dL   Nitrite, UA negative    Leukocytes, UA Negative  Negative    This visit occurred during the SARS-CoV-2 public health emergency.  Safety protocols were in place, including screening questions prior to the visit, additional usage of staff PPE, and extensive cleaning of exam room while observing appropriate contact time as indicated for disinfecting solutions.   COVID 19 screen:  No recent travel or known exposure to COVID19 The patient denies respiratory symptoms of COVID 19 at this time. The importance of social distancing was discussed today.   Assessment and Plan    Problem List Items Addressed This Visit    Dysuria - Primary    UA clear.  Likely bladder irritation.. possibly from citrus or holding urine.  Increase water, hold citris , avoids NSAIDs. Follow up if not improving as expected.      Relevant Orders   POCT Urinalysis Dipstick (Automated) (Completed)       Kerby Nora, MD

## 2020-03-15 ENCOUNTER — Other Ambulatory Visit: Payer: Self-pay

## 2020-03-15 ENCOUNTER — Telehealth (HOSPITAL_BASED_OUTPATIENT_CLINIC_OR_DEPARTMENT_OTHER): Payer: 59 | Admitting: Obstetrics and Gynecology

## 2020-03-15 DIAGNOSIS — F419 Anxiety disorder, unspecified: Secondary | ICD-10-CM | POA: Diagnosis not present

## 2020-03-15 DIAGNOSIS — Z79899 Other long term (current) drug therapy: Secondary | ICD-10-CM

## 2020-03-15 MED ORDER — ESCITALOPRAM OXALATE 10 MG PO TABS: 10.0000 mg | ORAL_TABLET | Freq: Every day | ORAL | 3 refills | Status: DC

## 2020-03-15 NOTE — Progress Notes (Signed)
PHQ- 1 GAD - 7

## 2020-03-15 NOTE — Progress Notes (Signed)
   TELEHEALTH VIRTUAL GYNECOLOGY VISIT ENCOUNTER NOTE  I connected with Breanna Tran on 03/15/20 at  4:00 PM EST by telephone at home and verified that I am speaking with the correct person using two identifiers.   I discussed the limitations, risks, security and privacy concerns of performing an evaluation and management service by telephone and the availability of in person appointments. I also discussed with the patient that there may be a patient responsible charge related to this service. The patient expressed understanding and agreed to proceed.  Chief Complaint: f/u re-starting lexapro  History:  Breanna Tran is a 50 y.o. G4P1020 being evaluated today for above CC. She denies any abnormal vaginal discharge, bleeding, pelvic pain or other concerns.    Patient seen on 1/13 for anx/depression due to various increased stressors and wanted to restart on Lexapro.   She states she is feeling much better and is doing well     Past Medical History:  Diagnosis Date  . Genital warts due to HPV (human papillomavirus) 2000   wart removed.   . Gestational diabetes    2 nd pregnancy only  . PONV (postoperative nausea and vomiting)   . Strep throat    10/18   Past Surgical History:  Procedure Laterality Date  . APPENDECTOMY  1993  . COSMETIC SURGERY Bilateral    ears  . PILONIDAL CYST EXCISION    . SPHINCTEROTOMY N/A 12/15/2016   Procedure: CHEMICAL SPHINCTEROTOMY (BOTOX);  Surgeon: Romie Levee, MD;  Location[facility];  Service: General;  Laterality: N/A;  . WISDOM TOOTH EXTRACTION  1995   The following portions of the patient's history were reviewed and updated as appropriate: allergies, current medications, past family history, past medical history, past social history, past surgical history and problem list.   PHQ-9 score: 1 vs 24 on 1/13  Review of Systems:  Pertinent items noted in HPI and remainder of comprehensive ROS otherwise  negative.  Physical Exam:   General:  Alert, oriented and cooperative.   Mental Status: Normal mood and affect perceived. Normal judgment and thought content.  Physical exam deferred due to nature of the encounter  Labs and Imaging No results found for this or any previous visit (from the past 336 hour(s)). No results found.    Assessment and Plan:     1. Anxiety Continue current course. I told her I'd recommend at least a six month course; she would like to do a year and I told her if she wants to come off to inbasket me so I can tell her how to taper. Patient amenable to plan  RTC: PRN       I discussed the assessment and treatment plan with the patient. The patient was provided an opportunity to ask questions and all were answered. The patient agreed with the plan and demonstrated an understanding of the instructions.   The patient was advised to call back or seek an in-person evaluation/go to the ED if the symptoms worsen or if the condition fails to improve as anticipated.  I provided 15 minutes of non-face-to-face time during this encounter. The visit was done via a MyChart visit.   [place] Bing, MD Center for [employer], [facility]

## 2020-07-14 ENCOUNTER — Telehealth: Payer: Self-pay | Admitting: *Deleted

## 2020-07-14 NOTE — Telephone Encounter (Signed)
Pt called and left message on voicemail asking if Dr Vergie Living could possibly give her one valium for a flight she is going on. Per Dr Vergie Living, pt will need to see PCP for this medication. Left message on pts voivce mail stating she will need to see PCP.

## 2020-07-28 ENCOUNTER — Ambulatory Visit (INDEPENDENT_AMBULATORY_CARE_PROVIDER_SITE_OTHER): Payer: 59 | Admitting: Internal Medicine

## 2020-07-28 ENCOUNTER — Other Ambulatory Visit: Payer: Self-pay

## 2020-07-28 ENCOUNTER — Encounter: Payer: Self-pay | Admitting: Internal Medicine

## 2020-07-28 DIAGNOSIS — Z6828 Body mass index (BMI) 28.0-28.9, adult: Secondary | ICD-10-CM | POA: Insufficient documentation

## 2020-07-28 DIAGNOSIS — Z6827 Body mass index (BMI) 27.0-27.9, adult: Secondary | ICD-10-CM

## 2020-07-28 DIAGNOSIS — F5104 Psychophysiologic insomnia: Secondary | ICD-10-CM

## 2020-07-28 DIAGNOSIS — E663 Overweight: Secondary | ICD-10-CM | POA: Diagnosis not present

## 2020-07-28 DIAGNOSIS — F419 Anxiety disorder, unspecified: Secondary | ICD-10-CM | POA: Diagnosis not present

## 2020-07-28 MED ORDER — DIAZEPAM 2 MG PO TABS: 2.0000 mg | ORAL_TABLET | Freq: Three times a day (TID) | ORAL | 0 refills | Status: DC | PRN

## 2020-07-28 NOTE — Assessment & Plan Note (Signed)
Managed on Escitalopram She will keep meeting with her therapist RX for limited supply of Valium 2 mg TID prn for flight anxiety Support offered

## 2020-07-28 NOTE — Progress Notes (Signed)
Subjective:    Patient ID: Breanna Tran, female    DOB: 06/11/1970, 50 y.o.   MRN: [id number]  HPI  Pt presents to the clinic today for follow up of chronic conditions.  Insomnia: Currently not an issue. She is not currently taking any medications for this. There is no sleep study on file.  Anxiety: Generalized, managed on Escitalopram. She reports she is about to fly to [place], would like something to take for flight anxiety. She is currently seeing a therapist. She denies depression, SI/HI.  Review of Systems     Past Medical History:  Diagnosis Date   Genital warts due to HPV (human papillomavirus) 2000   wart removed.    Gestational diabetes    2 nd pregnancy only   PONV (postoperative nausea and vomiting)    Strep throat    10/18    Current Outpatient Medications  Medication Sig Dispense Refill   escitalopram (LEXAPRO) 10 MG tablet Take 1 tablet (10 mg total) by mouth daily. 60 tablet 3   ibuprofen (ADVIL) 200 MG tablet Take 200 mg by mouth every 6 (six) hours as needed.     polyethylene glycol (MIRALAX / GLYCOLAX) 17 g packet Take 17 g by mouth daily.     No current facility-administered medications for this visit.    Allergies  Allergen Reactions   Augmentin [Amoxicillin-Pot Clavulanate] Diarrhea    Family History  Problem Relation Age of Onset   Cancer Father        renal   Cancer Maternal Grandfather        lung   Graves' disease Mother    Brain cancer Maternal Uncle    Breast cancer Neg Hx     Social History   Socioeconomic History   Marital status: Married    Spouse name: Not on file   Number of children: Not on file   Years of education: Not on file   Highest education level: Not on file  Occupational History   Not on file  Tobacco Use   Smoking status: Former    Packs/day: 0.50    Years: 15.00    Pack years: 7.50    Types: E-cigarettes, Cigarettes    Quit date: 01/10/2012    Years since quitting: 8.5   Smokeless tobacco: Never   Vaping Use   Vaping Use: Some days  Substance and Sexual Activity   Alcohol use: Yes    Alcohol/week: 1.0 standard drink    Types: 1 Glasses of wine per week    Comment: occ   Drug use: No   Sexual activity: Yes    Birth control/protection: Condom  Other Topics Concern   Not on file  Social History Narrative   Not on file   Social Determinants of Health   Financial Resource Strain: Not on file  Food Insecurity: Not on file  Transportation Needs: Not on file  Physical Activity: Not on file  Stress: Not on file  Social Connections: Not on file  Intimate Partner Violence: Not on file     Constitutional: Denies fever, malaise, fatigue, headache or abrupt weight changes.  HEENT: Denies eye pain, eye redness, ear pain, ringing in the ears, wax buildup, runny nose, nasal congestion, bloody nose, or sore throat. Respiratory: Denies difficulty breathing, shortness of breath, cough or sputum production.   Cardiovascular: Denies chest pain, chest tightness, palpitations or swelling in the hands or feet.  Gastrointestinal: Denies abdominal pain, bloating, constipation, diarrhea or blood in the stool.  GU: Pt reports irregular periods. Denies urgency, frequency, pain with urination, burning sensation, blood in urine, odor or discharge. Musculoskeletal: Denies decrease in range of motion, difficulty with gait, muscle pain or joint pain and swelling.  Skin: Denies redness, rashes, lesions or ulcercations.  Neurological: Pt reports insomnia. Denies dizziness, difficulty with memory, difficulty with speech or problems with balance and coordination.  Psych: Pt reports anxiety. Denies depression, SI/HI.  No other specific complaints in a complete review of systems (except as listed in HPI above).  Objective:   Physical Exam  BP (!) 101/57 (BP Location: Right Arm, Patient Position: Sitting, Cuff Size: Normal)   Pulse 63   Temp (!) 97.5 F (36.4 C) (Temporal)   Resp 17   Ht 5\' 7"  (1.702  m)   Wt 176 lb 3.2 oz (79.9 kg)   SpO2 100%   BMI 27.60 kg/m   Wt Readings from Last 3 Encounters:  02/27/20 160 lb (72.6 kg)  01/22/20 160 lb 6.4 oz (72.8 kg)  11/18/19 168 lb 4 oz (76.3 kg)    General: Appears her stated age, overweight, in NAD. Skin: Warm, dry and intact.  HEENT: Head: normal shape and size; Eyes: sclera white, and EOMs intact;  Cardiovascular: Normal rate and rhythm. S1,S2 noted.  No murmur, rubs or gallops noted.  Pulmonary/Chest: Normal effort and positive vesicular breath sounds. No respiratory distress. No wheezes, rales or ronchi noted.  Musculoskeletal: No difficulty with gait.  Neurological: Alert and oriented.  Psychiatric: Mood and affect normal. Behavior is normal. Judgment and thought content normal.     BMET    Component Value Date/Time   NA 142 01/22/2020 1121   K 4.4 01/22/2020 1121   CL 106 01/22/2020 1121   CO2 24 01/22/2020 1121   GLUCOSE 97 01/22/2020 1121   GLUCOSE 87 06/18/2017 1102   BUN 9 01/22/2020 1121   CREATININE 0.78 01/22/2020 1121   CREATININE 0.81 05/30/2012 0859   CALCIUM 9.1 01/22/2020 1121   GFRNONAA 91 01/22/2020 1121   GFRAA 105 01/22/2020 1121    Lipid Panel     Component Value Date/Time   CHOL 143 06/18/2017 1102   TRIG 55.0 06/18/2017 1102   HDL 56.10 06/18/2017 1102   CHOLHDL 3 06/18/2017 1102   VLDL 11.0 06/18/2017 1102   LDLCALC 76 06/18/2017 1102    CBC    Component Value Date/Time   WBC 8.2 01/22/2020 1124   WBC 8.2 06/18/2017 1102   RBC 4.81 01/22/2020 1124   RBC 4.26 06/18/2017 1102   HGB 14.6 01/22/2020 1124   HCT 42.7 01/22/2020 1124   PLT 264 01/22/2020 1124   MCV 89 01/22/2020 1124   MCH 30.4 01/22/2020 1124   MCH 28.7 05/30/2012 0859   MCHC 34.2 01/22/2020 1124   MCHC 34.0 06/18/2017 1102   RDW 12.3 01/22/2020 1124    Hgb A1C No results found for: HGBA1C          Assessment & Plan:   01/24/2020, NP This visit occurred during the SARS-CoV-2 public health emergency.   Safety protocols were in place, including screening questions prior to the visit, additional usage of staff PPE, and extensive cleaning of exam room while observing appropriate contact time as indicated for disinfecting solutions.

## 2020-07-28 NOTE — Assessment & Plan Note (Signed)
Encouraged diet and exercise for weight loss ?

## 2020-07-28 NOTE — Patient Instructions (Signed)

## 2020-07-28 NOTE — Assessment & Plan Note (Signed)
Currently not an issue Will monitor 

## 2020-08-21 ENCOUNTER — Emergency Department
Admission: EM | Admit: 2020-08-21 | Discharge: 2020-08-21 | Disposition: A | Discharge status: Home / Self Care | Payer: 59 | Source: Home / Self Care

## 2020-08-21 ENCOUNTER — Other Ambulatory Visit: Payer: Self-pay

## 2020-08-21 DIAGNOSIS — R300 Dysuria: Secondary | ICD-10-CM | POA: Diagnosis not present

## 2020-08-21 LAB — POCT URINALYSIS DIP (MANUAL ENTRY)
Bilirubin, UA: NEGATIVE
Blood, UA: NEGATIVE
Glucose, UA: NEGATIVE mg/dL
Nitrite, UA: NEGATIVE
Protein Ur, POC: NEGATIVE mg/dL
Spec Grav, UA: 1.025 (ref 1.010–1.025)
Urobilinogen, UA: 1 E.U./dL
pH, UA: 7 (ref 5.0–8.0)

## 2020-08-21 NOTE — ED Triage Notes (Signed)
Pt c/o dysuria x 1 week.  °

## 2020-08-21 NOTE — ED Provider Notes (Signed)
[facility]    CSN: [id number] Arrival date & time: 08/21/20  1524      History   Chief Complaint Chief Complaint  Patient presents with   Dysuria    HPI Breanna Tran is a 50 y.o. female.   HPI 50 year old female presents with dysuria for 1 week.  Past Medical History:  Diagnosis Date   Genital warts due to HPV (human papillomavirus) 2000   wart removed.    Gestational diabetes    2 nd pregnancy only   PONV (postoperative nausea and vomiting)    Strep throat    10/18    Patient Active Problem List   Diagnosis Date Noted   Overweight with body mass index (BMI) of 27 to 27.9 in adult 07/28/2020   Psychophysiological insomnia 05/21/2017   Anxiety 05/21/2017    Past Surgical History:  Procedure Laterality Date   APPENDECTOMY  1993   COSMETIC SURGERY Bilateral    ears   PILONIDAL CYST EXCISION     SPHINCTEROTOMY N/A 12/15/2016   Procedure: CHEMICAL SPHINCTEROTOMY (BOTOX);  Surgeon: Romie Levee, MD;  Location: [facility];  Service: General;  Laterality: N/A;   WISDOM TOOTH EXTRACTION  1995    OB History     Gravida  4   Para  2   Term  1   Preterm      AB  2   Living         SAB  2   IAB      Ectopic      Multiple      Live Births               Home Medications    Prior to Admission medications   Medication Sig Start Date End Date Taking? Authorizing Provider  diazepam (VALIUM) 2 MG tablet Take 1 tablet (2 mg total) by mouth every 8 (eight) hours as needed for anxiety. 07/28/20   Lorre Munroe, NP  escitalopram (LEXAPRO) 10 MG tablet Take 1 tablet (10 mg total) by mouth daily. 03/15/20   [place]ud[place]y Bing, MD  ibuprofen (ADVIL) 200 MG tablet Take 200 mg by mouth every 6 (six) hours as needed.    [provider]  polyethylene glycol (MIRALAX / GLYCOLAX) 17 g packet Take 17 g by mouth daily.    [provider]    Family History Family History  Problem Relation Age of Onset   Cancer  Father        renal   Cancer Maternal Grandfather        lung   Graves' disease Mother    Brain cancer Maternal Uncle    Breast cancer Neg Hx     Social History Social History   Tobacco Use   Smoking status: Former    Packs/day: 0.50    Years: 15.00    Pack years: 7.50    Types: E-cigarettes, Cigarettes    Quit date: 01/10/2012    Years since quitting: 8.6   Smokeless tobacco: Never  Vaping Use   Vaping Use: Some days  Substance Use Topics   Alcohol use: Yes    Alcohol/week: 1.0 standard drink    Types: 1 Glasses of wine per week    Comment: occ   Drug use: No     Allergies   Augmentin [amoxicillin-pot clavulanate]   Review of Systems Review of Systems  Genitourinary:  Positive for dysuria.  All other systems reviewed and are negative.   Physical  Exam Triage Vital Signs ED Triage Vitals  Enc Vitals Group     BP 08/21/20 1548 118/71     Pulse Rate 08/21/20 1548 (!) 58     Resp 08/21/20 1548 17     Temp 08/21/20 1548 97.9 F (36.6 C)     Temp Source 08/21/20 1548 Oral     SpO2 08/21/20 1548 100 %     Weight --      Height --      Head Circumference --      Peak Flow --      Pain Score 08/21/20 1549 0     Pain Loc --      Pain Edu? --      Excl. in GC? --    No data found.  Updated Vital Signs BP 118/71 (BP Location: Right Arm)   Pulse (!) 58   Temp 97.9 F (36.6 C) (Oral)   Resp 17   SpO2 100%    Physical Exam Vitals and nursing note reviewed.  Constitutional:      General: She is not in acute distress.    Appearance: Normal appearance. She is normal weight. She is not ill-appearing.  HENT:     Head: Normocephalic and atraumatic.     Nose: Nose normal.     Mouth/Throat:     Mouth: Mucous membranes are moist.     Pharynx: Oropharynx is clear.  Eyes:     Extraocular Movements: Extraocular movements intact.     Conjunctiva/sclera: Conjunctivae normal.     Pupils: Pupils are equal, round, and reactive to light.  Cardiovascular:     Rate  and Rhythm: Normal rate and regular rhythm.     Pulses: Normal pulses.     Heart sounds: Normal heart sounds.  Pulmonary:     Effort: Pulmonary effort is normal.     Breath sounds: Normal breath sounds. No wheezing, rhonchi or rales.  Abdominal:     Tenderness: There is no right CVA tenderness or left CVA tenderness.  Musculoskeletal:        General: Normal range of motion.     Cervical back: Normal range of motion and neck supple. No tenderness.  Lymphadenopathy:     Cervical: No cervical adenopathy.  Skin:    General: Skin is warm and dry.  Neurological:     General: No focal deficit present.     Mental Status: She is alert and oriented to person, place, and time. Mental status is at baseline.  Psychiatric:        Mood and Affect: Mood normal.        Behavior: Behavior normal.     UC Treatments / Results  Labs (all labs ordered are listed, but only abnormal results are displayed) Labs Reviewed  POCT URINALYSIS DIP (MANUAL ENTRY) - Abnormal; Notable for the following components:      Result Value   Ketones, POC UA small (15) (*)    Leukocytes, UA Small (1+) (*)    All other components within normal limits  URINE CULTURE    EKG   Radiology No results found.  Procedures Procedures (including critical care time)  Medications Ordered in UC Medications - No data to display  Initial Impression / Assessment and Plan / UC Course  I have reviewed the triage vital signs and the nursing notes.  Pertinent labs & imaging results that were available during my care of the patient were reviewed by me and considered in my medical decision making (  see chart for details).     MDM: 1. Dysuria-UA reveals above, urine culture ordered.  Follow-up with her once urine culture received.  Patient discharged home, hemodynamically stable. Final Clinical Impressions(s) / UC Diagnoses   Final diagnoses:  Dysuria     Discharge Instructions      Advised patient we will follow-up  with urine culture results once received.     ED Prescriptions   None    PDMP not reviewed this encounter.   Trevor Iha, FNP 08/21/20 [phone]

## 2020-08-21 NOTE — Discharge Instructions (Addendum)
Advised patient we will follow-up with urine culture results once received. 

## 2020-08-23 ENCOUNTER — Telehealth: Payer: Self-pay

## 2020-08-23 LAB — URINE CULTURE
MICRO NUMBER:: 12241156
SPECIMEN QUALITY:: ADEQUATE

## 2020-08-23 NOTE — Telephone Encounter (Signed)
Copied from CRM [id number]. Topic: General - Other >> Aug 23, 2020 11:46 AM Gaetana Michaelis A wrote: Reason for CRM: Patient shares that they were seen at [facility] in [place]  on 08/21/20 for urinary discomfort   The patient shares that they have received lab results and are uncertain of their meaning and would like to discuss with a member of clinical staff when possible   Please contact further when available    The pt was recommended to f/u back up with the urgent care to go over her urine culture and urinalysis result.

## 2020-08-31 ENCOUNTER — Ambulatory Visit: Payer: 59 | Admitting: Internal Medicine

## 2020-09-02 ENCOUNTER — Encounter: Payer: Self-pay | Admitting: Internal Medicine

## 2020-09-02 NOTE — Telephone Encounter (Signed)
Have her schedule a visit to discuss further.  I can add her on at 1 PM tomorrow

## 2020-09-03 NOTE — Telephone Encounter (Signed)
noted 

## 2020-09-28 ENCOUNTER — Ambulatory Visit
Admission: EM | Admit: 2020-09-28 | Discharge: 2020-09-28 | Disposition: A | Discharge status: Home / Self Care | Payer: 59 | Attending: Emergency Medicine | Admitting: Emergency Medicine

## 2020-09-28 ENCOUNTER — Encounter: Payer: Self-pay | Admitting: Emergency Medicine

## 2020-09-28 ENCOUNTER — Other Ambulatory Visit: Payer: Self-pay

## 2020-09-28 DIAGNOSIS — R300 Dysuria: Secondary | ICD-10-CM | POA: Diagnosis not present

## 2020-09-28 LAB — POCT URINALYSIS DIP (MANUAL ENTRY)
Bilirubin, UA: NEGATIVE
Glucose, UA: NEGATIVE mg/dL
Leukocytes, UA: NEGATIVE
Nitrite, UA: POSITIVE — AB
Protein Ur, POC: NEGATIVE mg/dL
Spec Grav, UA: <=1.005 — AB (ref 1.010–1.025)
Urobilinogen, UA: 0.2 E.U./dL
pH, UA: 5 (ref 5.0–8.0)

## 2020-09-28 MED ORDER — CEPHALEXIN 500 MG PO CAPS: 500.0000 mg | ORAL_CAPSULE | Freq: Two times a day (BID) | ORAL | 0 refills | Status: DC

## 2020-09-28 NOTE — ED Triage Notes (Signed)
Pt c/o of dysuria and urinary frequency x 1 mth. She was seen at an urgent care 3 weeks ago but was advised her urine was clear.

## 2020-09-28 NOTE — Discharge Instructions (Addendum)
Take the antibiotic as directed.  The urine culture is pending.  We will call you if it shows the need to change or discontinue your antibiotic.    Follow up with your primary care provider if your symptoms are not improving.    

## 2020-09-28 NOTE — ED Provider Notes (Signed)
[name]    CSN: [id number] Arrival date & time: 09/28/20  1546      History   Chief Complaint Chief Complaint  Patient presents with   Dysuria   Urinary Frequency     HPI Breanna Tran is a 50 y.o. female.  Patient presents with dysuria and frequency x1 month.  She denies fever, chills, abdominal pain, flank pain, vaginal discharge, pelvic pain, or other symptoms.  Treatment attempted at home with Azo.  Patient was seen at [facility] urgent care on 08/21/2020; diagnosed with dysuria; no treatment at that time; urine culture grew mixed flora.  The history is provided by the patient and medical records.   Past Medical History:  Diagnosis Date   Genital warts due to HPV (human papillomavirus) 2000   wart removed.    Gestational diabetes    2 nd pregnancy only   PONV (postoperative nausea and vomiting)    Strep throat    10/18    Patient Active Problem List   Diagnosis Date Noted   Overweight with body mass index (BMI) of 27 to 27.9 in adult 07/28/2020   Psychophysiological insomnia 05/21/2017   Anxiety 05/21/2017    Past Surgical History:  Procedure Laterality Date   APPENDECTOMY  1993   COSMETIC SURGERY Bilateral    ears   PILONIDAL CYST EXCISION     SPHINCTEROTOMY N/A 12/15/2016   Procedure: CHEMICAL SPHINCTEROTOMY (BOTOX);  Surgeon: Romie Levee, MD;  Location: [facility];  Service: General;  Laterality: N/A;   WISDOM TOOTH EXTRACTION  1995    OB History     Gravida  4   Para  2   Term  1   Preterm      AB  2   Living         SAB  2   IAB      Ectopic      Multiple      Live Births               Home Medications    Prior to Admission medications   Medication Sig Start Date End Date Taking? Authorizing Provider  cephALEXin (KEFLEX) 500 MG capsule Take 1 capsule (500 mg total) by mouth 2 (two) times daily for 5 days. 09/28/20 10/03/20 Yes Mickie Bail, NP  escitalopram (LEXAPRO) 10 MG tablet Take  1 tablet (10 mg total) by mouth daily. 03/15/20  Yes [place] Bing, MD  ibuprofen (ADVIL) 200 MG tablet Take 200 mg by mouth every 6 (six) hours as needed.   Yes [provider]  polyethylene glycol (MIRALAX / GLYCOLAX) 17 g packet Take 17 g by mouth daily.   Yes [provider]  diazepam (VALIUM) 2 MG tablet Take 1 tablet (2 mg total) by mouth every 8 (eight) hours as needed for anxiety. 07/28/20   Lorre Munroe, NP    Family History Family History  Problem Relation Age of Onset   Cancer Father        renal   Cancer Maternal Grandfather        lung   Graves' disease Mother    Brain cancer Maternal Uncle    Breast cancer Neg Hx     Social History Social History   Tobacco Use   Smoking status: Former    Packs/day: 0.50    Years: 15.00    Pack years: 7.50    Types: E-cigarettes, Cigarettes    Quit date: 01/10/2012  Years since quitting: 8.7   Smokeless tobacco: Never  Vaping Use   Vaping Use: Some days  Substance Use Topics   Alcohol use: Yes    Alcohol/week: 1.0 standard drink    Types: 1 Glasses of wine per week    Comment: occ   Drug use: No     Allergies   Augmentin [amoxicillin-pot clavulanate]   Review of Systems Review of Systems  Constitutional:  Negative for chills and fever.  Respiratory:  Negative for cough and shortness of breath.   Cardiovascular:  Negative for chest pain and palpitations.  Gastrointestinal:  Negative for abdominal pain and vomiting.  Genitourinary:  Positive for dysuria and frequency. Negative for flank pain, hematuria, pelvic pain and vaginal discharge.  Skin:  Negative for color change and rash.  All other systems reviewed and are negative.   Physical Exam Triage Vital Signs ED Triage Vitals  Enc Vitals Group     BP      Pulse      Resp      Temp      Temp src      SpO2      Weight      Height      Head Circumference      Peak Flow      Pain Score      Pain Loc      Pain Edu?      Excl. in GC?     No data found.  Updated Vital Signs BP 119/81 (BP Location: Left Arm)   Pulse 67   Temp 98.7 F (37.1 C) (Oral)   SpO2 98%   Visual Acuity Right Eye Distance:   Left Eye Distance:   Bilateral Distance:    Right Eye Near:   Left Eye Near:    Bilateral Near:     Physical Exam Vitals and nursing note reviewed.  Constitutional:      General: She is not in acute distress.    Appearance: She is well-developed. She is not ill-appearing.  HENT:     Head: Normocephalic and atraumatic.     Mouth/Throat:     Mouth: Mucous membranes are moist.  Eyes:     Conjunctiva/sclera: Conjunctivae normal.  Cardiovascular:     Rate and Rhythm: Normal rate and regular rhythm.     Heart sounds: Normal heart sounds.  Pulmonary:     Effort: Pulmonary effort is normal. No respiratory distress.     Breath sounds: Normal breath sounds.  Abdominal:     Palpations: Abdomen is soft.     Tenderness: There is no abdominal tenderness. There is no right CVA tenderness, left CVA tenderness, guarding or rebound.  Musculoskeletal:     Cervical back: Neck supple.  Skin:    General: Skin is warm and dry.  Neurological:     General: No focal deficit present.     Mental Status: She is alert and oriented to person, place, and time.     Gait: Gait normal.  Psychiatric:        Mood and Affect: Mood normal.        Behavior: Behavior normal.     UC Treatments / Results  Labs (all labs ordered are listed, but only abnormal results are displayed) Labs Reviewed  POCT URINALYSIS DIP (MANUAL ENTRY) - Abnormal; Notable for the following components:      Result Value   Ketones, POC UA large (80) (*)    Spec Grav, UA <=1.005 (*)  Blood, UA small (*)    Nitrite, UA Positive (*)    All other components within normal limits  URINE CULTURE    EKG   Radiology No results found.  Procedures Procedures (including critical care time)  Medications Ordered in UC Medications - No data to  display  Initial Impression / Assessment and Plan / UC Course  I have reviewed the triage vital signs and the nursing notes.  Pertinent labs & imaging results that were available during my care of the patient were reviewed by me and considered in my medical decision making (see chart for details).  Dysuria.  Patient has been symptomatic for 4 weeks.  Treating with Keflex. Urine culture pending. Discussed with patient that we will call her if the urine culture shows the need to change or discontinue the antibiotic. Instructed her to follow-up with her PCP if her symptoms are not improving. Patient agrees to plan of care.      Final Clinical Impressions(s) / UC Diagnoses   Final diagnoses:  Dysuria     Discharge Instructions      Take the antibiotic as directed.  The urine culture is pending.  We will call you if it shows the need to change or discontinue your antibiotic.    Follow up with your primary care provider if your symptoms are not improving.         ED Prescriptions     Medication Sig Dispense Auth. Provider   cephALEXin (KEFLEX) 500 MG capsule Take 1 capsule (500 mg total) by mouth 2 (two) times daily for 5 days. 10 capsule Mickie Bail, NP      PDMP not reviewed this encounter.   Mickie Bail, NP 09/28/20 [phone]

## 2020-09-30 ENCOUNTER — Ambulatory Visit: Payer: 59 | Admitting: Internal Medicine

## 2020-09-30 ENCOUNTER — Other Ambulatory Visit (HOSPITAL_COMMUNITY)
Admission: RE | Admit: 2020-09-30 | Discharge: 2020-09-30 | Disposition: A | Discharge status: Home / Self Care | Payer: 59 | Source: Ambulatory Visit | Attending: Internal Medicine | Admitting: Internal Medicine

## 2020-09-30 ENCOUNTER — Other Ambulatory Visit: Payer: Self-pay

## 2020-09-30 ENCOUNTER — Encounter: Payer: Self-pay | Admitting: Internal Medicine

## 2020-09-30 VITALS — BP 95/61 | HR 66 | Temp 97.1°F | Resp 17 | Ht 67.0 in | Wt 174.8 lb

## 2020-09-30 DIAGNOSIS — R350 Frequency of micturition: Secondary | ICD-10-CM | POA: Diagnosis not present

## 2020-09-30 DIAGNOSIS — R300 Dysuria: Secondary | ICD-10-CM | POA: Diagnosis not present

## 2020-09-30 DIAGNOSIS — R3915 Urgency of urination: Secondary | ICD-10-CM

## 2020-09-30 DIAGNOSIS — R3989 Other symptoms and signs involving the genitourinary system: Secondary | ICD-10-CM | POA: Diagnosis not present

## 2020-09-30 DIAGNOSIS — N898 Other specified noninflammatory disorders of vagina: Secondary | ICD-10-CM

## 2020-09-30 LAB — URINE CULTURE: Culture: NO GROWTH

## 2020-09-30 LAB — POCT URINALYSIS DIPSTICK
Bilirubin, UA: NEGATIVE
Blood, UA: NEGATIVE
Glucose, UA: NEGATIVE
Ketones, UA: NEGATIVE
Leukocytes, UA: NEGATIVE
Nitrite, UA: NEGATIVE
Protein, UA: NEGATIVE
Spec Grav, UA: 1.015 (ref 1.010–1.025)
Urobilinogen, UA: 0.2 E.U./dL
pH, UA: 5 (ref 5.0–8.0)

## 2020-09-30 LAB — POCT GLYCOSYLATED HEMOGLOBIN (HGB A1C): Hemoglobin A1C: 5.2 % (ref 4.0–5.6)

## 2020-09-30 NOTE — Patient Instructions (Signed)
Dysuria ?Dysuria is pain or discomfort during urination. The pain or discomfort may be felt in the part of the body that drains urine from the bladder (urethra) or in the surrounding tissue of the genitals. The pain may also be felt in the groin area, lower abdomen, or lower back. ?You may have to urinate frequently or have the sudden feeling that you have to urinate (urgency). Dysuria can affect anyone, but it is more common in females. Dysuria can be caused by many different things, including: ?Urinary tract infection. ?Kidney stones or bladder stones. ?Certain STIs (sexually transmitted infections), such as chlamydia. ?Dehydration. ?Inflammation of the tissues of the vagina. ?Use of certain medicines. ?Use of certain soaps or scented products that cause irritation. ?Follow these instructions at home: ?Medicines ?Take over-the-counter and prescription medicines only as told by your health care provider. ?If you were prescribed an antibiotic medicine, take it as told by your health care provider. Do not stop taking the antibiotic even if you start to feel better. ?Eating and drinking ? ?Drink enough fluid to keep your urine pale yellow. ?Avoid caffeinated beverages, tea, and alcohol. These beverages can irritate the bladder and make dysuria worse. In males, alcohol may irritate the prostate. ?General instructions ?Watch your condition for any changes. ?Urinate often. Avoid holding urine for long periods of time. ?If you are female, you should wipe from front to back after urinating or having a bowel movement. Use each piece of toilet paper only once. ?Empty your bladder after sex. ?Keep all follow-up visits. This is important. ?If you had any tests done to find the cause of dysuria, it is up to you to get your test results. Ask your health care provider, or the department that is doing the test, when your results will be ready. ?Contact a health care provider if: ?You have a fever. ?You develop pain in your back or  sides. ?You have nausea or vomiting. ?You have blood in your urine. ?You are not urinating as often as you usually do. ?Get help right away if: ?Your pain is severe and not relieved with medicines. ?You cannot eat or drink without vomiting. ?You are confused. ?You have a rapid heartbeat while resting. ?You have shaking or chills. ?You feel extremely weak. ?Summary ?Dysuria is pain or discomfort while urinating. Many different conditions can lead to dysuria. ?If you have dysuria, you may have to urinate frequently or have the sudden feeling that you have to urinate (urgency). ?Watch your condition for any changes. Keep all follow-up visits. ?Make sure that you urinate often and drink enough fluid to keep your urine pale yellow. ?This information is not intended to replace advice given to you by your health care provider. Make sure you discuss any questions you have with your health care provider. ?Document Revised: 08/08/2019 Document Reviewed: 08/08/2019 ?Elsevier Patient Education ? 2022 Elsevier Inc[employer] ? ?

## 2020-09-30 NOTE — Progress Notes (Signed)
Subjective:    Patient ID: Breanna Tran, female    DOB: Feb 15, 1970, 50 y.o.   MRN: [id number]  HPI  Pt presents to the clinic today for urgent care follow up. She went to the [facility] 09/28/20 with c/o urinary urgency, frequency and dysuria. Urinalysis was concerning for infection. Urine culture did not grow any bacteria. She was initially started on Keflex.  She was seen at urgent care 8/13 for the same.  Urinalysis was concerning for infection.  Urine culture at that time showed contamination.  She reports continued urgency, frequency and dysuria.  She has had some bladder pressure.  She reports vaginal itching and irritation.  She denies fever, chills, nausea, vomiting, blood in her urine, abnormal vaginal bleeding, vaginal discharge or odor.  She reports she has not had a period in almost a year.  Review of Systems     Past Medical History:  Diagnosis Date   Genital warts due to HPV (human papillomavirus) 2000   wart removed.    Gestational diabetes    2 nd pregnancy only   PONV (postoperative nausea and vomiting)    Strep throat    10/18    Current Outpatient Medications  Medication Sig Dispense Refill   cephALEXin (KEFLEX) 500 MG capsule Take 1 capsule (500 mg total) by mouth 2 (two) times daily for 5 days. 10 capsule 0   diazepam (VALIUM) 2 MG tablet Take 1 tablet (2 mg total) by mouth every 8 (eight) hours as needed for anxiety. 10 tablet 0   escitalopram (LEXAPRO) 10 MG tablet Take 1 tablet (10 mg total) by mouth daily. 60 tablet 3   ibuprofen (ADVIL) 200 MG tablet Take 200 mg by mouth every 6 (six) hours as needed.     polyethylene glycol (MIRALAX / GLYCOLAX) 17 g packet Take 17 g by mouth daily.     No current facility-administered medications for this visit.    Allergies  Allergen Reactions   Augmentin [Amoxicillin-Pot Clavulanate] Diarrhea    Family History  Problem Relation Age of Onset   Cancer Father        renal   Cancer Maternal Grandfather        lung    Graves' disease Mother    Brain cancer Maternal Uncle    Breast cancer Neg Hx     Social History   Socioeconomic History   Marital status: Married    Spouse name: Not on file   Number of children: Not on file   Years of education: Not on file   Highest education level: Not on file  Occupational History   Not on file  Tobacco Use   Smoking status: Former    Packs/day: 0.50    Years: 15.00    Pack years: 7.50    Types: E-cigarettes, Cigarettes    Quit date: 01/10/2012    Years since quitting: 8.7   Smokeless tobacco: Never  Vaping Use   Vaping Use: Some days  Substance and Sexual Activity   Alcohol use: Yes    Alcohol/week: 1.0 standard drink    Types: 1 Glasses of wine per week    Comment: occ   Drug use: No   Sexual activity: Yes    Birth control/protection: Condom  Other Topics Concern   Not on file  Social History Narrative   Not on file   Social Determinants of Health   Financial Resource Strain: Not on file  Food Insecurity: Not on file  Transportation Needs: Not  on file  Physical Activity: Not on file  Stress: Not on file  Social Connections: Not on file  Intimate Partner Violence: Not on file     Constitutional: Denies fever, malaise, fatigue, headache or abrupt weight changes.  Respiratory: Denies difficulty breathing, shortness of breath, cough or sputum production.   Cardiovascular: Denies chest pain, chest tightness, palpitations or swelling in the hands or feet.  Gastrointestinal: Patient reports bladder pressure.  Denies abdominal pain, bloating, constipation, diarrhea or blood in the stool.  GU: Patient reports urinary urgency, frequency, dysuria, vaginal itching and irritation.  Denies burning sensation, blood in urine, odor or discharge. Skin: Denies redness, rashes, lesions or ulcercations.   No other specific complaints in a complete review of systems (except as listed in HPI above).  Objective:   Physical Exam  BP 95/61 (BP Location:  Right Arm, Patient Position: Sitting, Cuff Size: Normal)   Pulse 66   Temp (!) 97.1 F (36.2 C) (Temporal)   Resp 17   Ht 5\' 7"  (1.702 m)   Wt 174 lb 12.8 oz (79.3 kg)   SpO2 100%   BMI 27.38 kg/m   Wt Readings from Last 3 Encounters:  07/28/20 176 lb 3.2 oz (79.9 kg)  02/27/20 160 lb (72.6 kg)  01/22/20 160 lb 6.4 oz (72.8 kg)    General: Appears her stated age, overweight, in NAD. Skin: Warm, dry and intact.   Cardiovascular: Normal rate and rhythm. S1,S2 noted.  No murmur, rubs or gallops noted.  Pulmonary/Chest: Normal effort and positive vesicular breath sounds. No respiratory distress. No wheezes, rales or ronchi noted.  Abdomen: Soft and mildly tender over the bladder.  No CVA tenderness noted. Neurological: Alert and oriented.   BMET    Component Value Date/Time   NA 142 01/22/2020 1121   K 4.4 01/22/2020 1121   CL 106 01/22/2020 1121   CO2 24 01/22/2020 1121   GLUCOSE 97 01/22/2020 1121   GLUCOSE 87 06/18/2017 1102   BUN 9 01/22/2020 1121   CREATININE 0.78 01/22/2020 1121   CREATININE 0.81 05/30/2012 0859   CALCIUM 9.1 01/22/2020 1121   GFRNONAA 91 01/22/2020 1121   GFRAA 105 01/22/2020 1121    Lipid Panel     Component Value Date/Time   CHOL 143 06/18/2017 1102   TRIG 55.0 06/18/2017 1102   HDL 56.10 06/18/2017 1102   CHOLHDL 3 06/18/2017 1102   VLDL 11.0 06/18/2017 1102   LDLCALC 76 06/18/2017 1102    CBC    Component Value Date/Time   WBC 8.2 01/22/2020 1124   WBC 8.2 06/18/2017 1102   RBC 4.81 01/22/2020 1124   RBC 4.26 06/18/2017 1102   HGB 14.6 01/22/2020 1124   HCT 42.7 01/22/2020 1124   PLT 264 01/22/2020 1124   MCV 89 01/22/2020 1124   MCH 30.4 01/22/2020 1124   MCH 28.7 05/30/2012 0859   MCHC 34.2 01/22/2020 1124   MCHC 34.0 06/18/2017 1102   RDW 12.3 01/22/2020 1124    Hgb A1C No results found for: HGBA1C          Assessment & Plan:  UC Follow Up for Bladder Pressure, Urinary Urgency, Frequency, Dysuria, Vaginal  Itching and Irritation:  UC notes and labs reviewed. We will have her stop Keflex if she has not done so already Urinalysis: Normal POCT A1c 5.2% We will check urine gonorrhea, chlamydia, trichomonas, yeast and BV Push fluids  We will follow-up after labs, repeat return precautions discussed 01/24/2020, NP This visit occurred  during the SARS-CoV-2 public health emergency.  Safety protocols were in place, including screening questions prior to the visit, additional usage of staff PPE, and extensive cleaning of exam room while observing appropriate contact time as indicated for disinfecting solutions.

## 2020-10-04 LAB — URINE CYTOLOGY ANCILLARY ONLY
Bacterial Vaginitis-Urine: NEGATIVE
Candida Urine: NEGATIVE
Chlamydia: NEGATIVE
Comment: NEGATIVE
Comment: NEGATIVE
Comment: NORMAL
Neisseria Gonorrhea: NEGATIVE
Trichomonas: NEGATIVE

## 2020-10-20 ENCOUNTER — Other Ambulatory Visit: Payer: Self-pay | Admitting: Internal Medicine

## 2020-10-20 DIAGNOSIS — Z1231 Encounter for screening mammogram for malignant neoplasm of breast: Secondary | ICD-10-CM

## 2020-11-14 ENCOUNTER — Other Ambulatory Visit: Payer: Self-pay | Admitting: Obstetrics and Gynecology

## 2020-11-17 ENCOUNTER — Other Ambulatory Visit: Payer: Self-pay | Admitting: Internal Medicine

## 2020-11-17 NOTE — Telephone Encounter (Signed)
Copied from CRM [id number]. Topic: Quick Communication - Rx Refill/Question >> Nov 17, 2020  1:31 PM Marylen Ponto wrote: Pt stated due to insurance she needs the Rx to be for 90 day supply.   Medication: escitalopram (LEXAPRO) 10 MG tablet  Has the patient contacted their pharmacy? No. Pt stated pcp told her to call to request Rx refill due to another provider originally prescribed the medication (Agent: If no, request that the patient contact the pharmacy for the refill. If patient does not wish to contact the pharmacy document the reason why and proceed with request.) (Agent: If yes, when and what did the pharmacy advise?)  Preferred Pharmacy (with phone number or street name): [facility] [phone] [place], [place] - [address] Phone: [phone]   Fax: [phone]  Has the patient been seen for an appointment in the last year OR does the patient have an upcoming appointment? Yes.    Agent: Please be advised that RX refills may take up to 3 business days. We ask that you follow-up with your pharmacy.

## 2020-11-17 NOTE — Telephone Encounter (Signed)
Request 90 day supply

## 2020-11-18 MED ORDER — ESCITALOPRAM OXALATE 10 MG PO TABS: 10.0000 mg | ORAL_TABLET | Freq: Every day | ORAL | 0 refills | Status: DC

## 2020-11-18 NOTE — Telephone Encounter (Signed)
Requested medication (s) are due for refill today:   Yes  Requested medication (s) are on the active medication list:   Yes  Future visit scheduled:   No   Last ordered: 03/15/2020 #60, 3 refills  This was prescribed by anotherprovider but looks like Rene Kocher may be going to take over ordering it.   Not sure.   Requested Prescriptions  Pending Prescriptions Disp Refills   escitalopram (LEXAPRO) 10 MG tablet 60 tablet 3    Sig: Take 1 tablet (10 mg total) by mouth daily.     Psychiatry:  Antidepressants - SSRI Passed - 11/17/2020  2:51 PM      Passed - Valid encounter within last 6 months    Recent Outpatient Visits           1 month ago Urinary urgency   [facility] [place], [place], NP   3 months ago Psychophysiological insomnia   [facility] [place], [place], [place]

## 2020-11-19 ENCOUNTER — Other Ambulatory Visit: Payer: Self-pay | Admitting: Internal Medicine

## 2020-11-19 ENCOUNTER — Ambulatory Visit
Admission: RE | Admit: 2020-11-19 | Discharge: 2020-11-19 | Disposition: A | Discharge status: Home / Self Care | Payer: 59 | Source: Ambulatory Visit

## 2020-11-19 DIAGNOSIS — Z1231 Encounter for screening mammogram for malignant neoplasm of breast: Secondary | ICD-10-CM

## 2020-11-19 MED ORDER — ESCITALOPRAM OXALATE 10 MG PO TABS: 10.0000 mg | ORAL_TABLET | Freq: Every day | ORAL | 0 refills | Status: DC

## 2020-12-27 ENCOUNTER — Encounter: Payer: Self-pay | Admitting: Internal Medicine

## 2020-12-28 ENCOUNTER — Other Ambulatory Visit: Payer: Self-pay | Admitting: Internal Medicine

## 2020-12-28 ENCOUNTER — Other Ambulatory Visit: Payer: 59

## 2020-12-28 DIAGNOSIS — Z789 Other specified health status: Secondary | ICD-10-CM

## 2020-12-29 ENCOUNTER — Encounter: Payer: Self-pay | Admitting: Internal Medicine

## 2020-12-29 LAB — HEPATITIS B SURFACE ANTIBODY,QUALITATIVE: Hep B S Ab: REACTIVE — AB

## 2021-03-09 ENCOUNTER — Ambulatory Visit (INDEPENDENT_AMBULATORY_CARE_PROVIDER_SITE_OTHER): Payer: 59 | Admitting: Internal Medicine

## 2021-03-09 ENCOUNTER — Other Ambulatory Visit: Payer: Self-pay

## 2021-03-09 ENCOUNTER — Encounter: Payer: Self-pay | Admitting: Internal Medicine

## 2021-03-09 VITALS — BP 87/61 | HR 73 | Temp 97.3°F | Ht 68.0 in | Wt 187.0 lb

## 2021-03-09 DIAGNOSIS — Z0001 Encounter for general adult medical examination with abnormal findings: Secondary | ICD-10-CM

## 2021-03-09 DIAGNOSIS — E663 Overweight: Secondary | ICD-10-CM | POA: Diagnosis not present

## 2021-03-09 DIAGNOSIS — Z1211 Encounter for screening for malignant neoplasm of colon: Secondary | ICD-10-CM | POA: Diagnosis not present

## 2021-03-09 DIAGNOSIS — Z6828 Body mass index (BMI) 28.0-28.9, adult: Secondary | ICD-10-CM

## 2021-03-09 DIAGNOSIS — Z1159 Encounter for screening for other viral diseases: Secondary | ICD-10-CM

## 2021-03-09 DIAGNOSIS — Z6827 Body mass index (BMI) 27.0-27.9, adult: Secondary | ICD-10-CM

## 2021-03-09 NOTE — Progress Notes (Signed)
? ?Subjective:  ? ? Patient ID: Breanna Tran, female    DOB: 31-Aug-1970, 51 y.o.   MRN: [id number] ? ?HPI ? ?Patient presents the clinic today for her annual exam. ? ?Flu: never ?Tetanus: 01/2007 ?COVID: never ?Pap smear: 06/2017 ?Mammogram: 11/2020 ?Colon screening: never ?Vision screening: every 2 years ?Dentist: as needed ? ?Diet: She does eat meat. She consumes fruits and veggies. She tries to avoid fried foods. She drinks mostly water and coffee. ?Exercise: Walking ? ?Review of Systems ? ?Past Medical History:  ?Diagnosis Date  ? Genital warts due to HPV (human papillomavirus) 2000  ? wart removed.   ? Gestational diabetes   ? 2 nd pregnancy only  ? PONV (postoperative nausea and vomiting)   ? Strep throat   ? 10/18  ? ? ?Current Outpatient Medications  ?Medication Sig Dispense Refill  ? escitalopram (LEXAPRO) 10 MG tablet Take 1 tablet (10 mg total) by mouth daily. 90 tablet 0  ? ibuprofen (ADVIL) 200 MG tablet Take 200 mg by mouth every 6 (six) hours as needed.    ? polyethylene glycol (MIRALAX / GLYCOLAX) 17 g packet Take 17 g by mouth daily.    ? ?No current facility-administered medications for this visit.  ? ? ?Allergies  ?Allergen Reactions  ? Augmentin [Amoxicillin-Pot Clavulanate] Diarrhea  ? ? ?Family History  ?Problem Relation Age of Onset  ? Cancer Father   ?     renal  ? Cancer Maternal Grandfather   ?     lung  ? Graves' disease Mother   ? Brain cancer Maternal Uncle   ? Breast cancer Neg Hx   ? ? ?Social History  ? ?Socioeconomic History  ? Marital status: Married  ?  Spouse name: Not on file  ? Number of children: Not on file  ? Years of education: Not on file  ? Highest education level: Not on file  ?Occupational History  ? Not on file  ?Tobacco Use  ? Smoking status: Former  ?  Packs/day: 0.50  ?  Years: 15.00  ?  Pack years: 7.50  ?  Types: E-cigarettes, Cigarettes  ?  Quit date: 01/10/2012  ?  Years since quitting: 9.1  ? Smokeless tobacco: Never  ?Vaping Use  ? Vaping Use: Some days   ?Substance and Sexual Activity  ? Alcohol use: Yes  ?  Alcohol/week: 1.0 standard drink  ?  Types: 1 Glasses of wine per week  ?  Comment: occ  ? Drug use: No  ? Sexual activity: Yes  ?  Birth control/protection: Condom  ?Other Topics Concern  ? Not on file  ?Social History Narrative  ? Not on file  ? ?Social Determinants of Health  ? ?Financial Resource Strain: Not on file  ?Food Insecurity: Not on file  ?Transportation Needs: Not on file  ?Physical Activity: Not on file  ?Stress: Not on file  ?Social Connections: Not on file  ?Intimate Partner Violence: Not on file  ? ? ? ?Constitutional: Denies fever, malaise, fatigue, headache or abrupt weight changes.  ?HEENT: Denies eye pain, eye redness, ear pain, ringing in the ears, wax buildup, runny nose, nasal congestion, bloody nose, or sore throat. ?Respiratory: Denies difficulty breathing, shortness of breath, cough or sputum production.   ?Cardiovascular: Denies chest pain, chest tightness, palpitations or swelling in the hands or feet.  ?Gastrointestinal: Pt reports intermittent reflux. Denies abdominal pain, bloating, constipation, diarrhea or blood in the stool.  ?GU: Denies urgency, frequency, pain with urination, burning  sensation, blood in urine, odor or discharge. ?Musculoskeletal: Pt reports intermittent low back pain. Denies decrease in range of motion, difficulty with gait, or joint swelling.  ?Skin: Denies redness, rashes, lesions or ulcercations.  ?Neurological: Patient reports insomnia.  Denies dizziness, difficulty with memory, difficulty with speech or problems with balance and coordination.  ?Psych: Patient has a history of anxiety.  Denies depression, SI/HI. ? ?No other specific complaints in a complete review of systems (except as listed in HPI above). ? ?   ?Objective:  ? Physical Exam ? ? ?BP (!) 87/61 (BP Location: Right Arm, Patient Position: Sitting, Cuff Size: Large)   Pulse 73   Temp (!) 97.3 ?F (36.3 ?C) (Temporal)   Ht $R'5\' 8"'TO$  (1.727 m)    Wt 187 lb (84.8 kg)   SpO2 100%   BMI 28.43 kg/m?  ? ?Wt Readings from Last 3 Encounters:  ?09/30/20 174 lb 12.8 oz (79.3 kg)  ?07/28/20 176 lb 3.2 oz (79.9 kg)  ?02/27/20 160 lb (72.6 kg)  ? ? ?General: Appears her stated age, overweight, in NAD. ?Skin: Warm, dry and intact.  ?HEENT: Head: normal shape and size; Eyes: sclera white and EOMs intact;  ?Neck:  Neck supple, trachea midline. No masses, lumps or thyromegaly present.  ?Cardiovascular: Normal rate and rhythm. S1,S2 noted.  No murmur, rubs or gallops noted. No JVD or BLE edema. ?Pulmonary/Chest: Normal effort and positive vesicular breath sounds. No respiratory distress. No wheezes, rales or ronchi noted.  ?Abdomen: Soft and nontender. Normal bowel sounds.  ?Musculoskeletal: Strength 5/5 BUE/BLE.  No difficulty with gait.  ?Neurological: Alert and oriented. Cranial nerves II-XII grossly intact. Coordination normal.  ?Psychiatric: Mood and affect normal. Behavior is normal. Judgment and thought content normal.  ? ? ? ?BMET ?   ?Component Value Date/Time  ? NA 142 01/22/2020 1121  ? K 4.4 01/22/2020 1121  ? CL 106 01/22/2020 1121  ? CO2 24 01/22/2020 1121  ? GLUCOSE 97 01/22/2020 1121  ? GLUCOSE 87 06/18/2017 1102  ? BUN 9 01/22/2020 1121  ? CREATININE 0.78 01/22/2020 1121  ? CREATININE 0.81 05/30/2012 0859  ? CALCIUM 9.1 01/22/2020 1121  ? GFRNONAA 91 01/22/2020 1121  ? GFRAA 105 01/22/2020 1121  ? ? ?Lipid Panel  ?   ?Component Value Date/Time  ? CHOL 143 06/18/2017 1102  ? TRIG 55.0 06/18/2017 1102  ? HDL 56.10 06/18/2017 1102  ? CHOLHDL 3 06/18/2017 1102  ? VLDL 11.0 06/18/2017 1102  ? LDLCALC 76 06/18/2017 1102  ? ? ?CBC ?   ?Component Value Date/Time  ? WBC 8.2 01/22/2020 1124  ? WBC 8.2 06/18/2017 1102  ? RBC 4.81 01/22/2020 1124  ? RBC 4.26 06/18/2017 1102  ? HGB 14.6 01/22/2020 1124  ? HCT 42.7 01/22/2020 1124  ? PLT 264 01/22/2020 1124  ? MCV 89 01/22/2020 1124  ? MCH 30.4 01/22/2020 1124  ? MCH 28.7 05/30/2012 0859  ? MCHC 34.2 01/22/2020 1124   ? MCHC 34.0 06/18/2017 1102  ? RDW 12.3 01/22/2020 1124  ? ? ?Hgb A1C ?Lab Results  ?Component Value Date  ? HGBA1C 5.2 09/30/2020  ? ? ? ? ? ? ?   ?Assessment & Plan:  ? ?Preventative Health Maintenance: ? ?She declines flu shot ?She declines tetanus vaccine ?Encouraged her to get her COVID-vaccine ?Pap smear UTD ?Mammogram due 11/2021 ?Referral to GI for screening colonoscopy ?Encouraged her to consume a balanced diet and exercise regimen ?Advised her to see an eye doctor and dentist annually ?We will  check CBC, c-Met lipid and hep C today ? ?RTC in 6 months, follow-up chronic conditions ?Webb Silversmith, NP ?This visit occurred during the SARS-CoV-2 public health emergency.  Safety protocols were in place, including screening questions prior to the visit, additional usage of staff PPE, and extensive cleaning of exam room while observing appropriate contact time as indicated for disinfecting solutions.  ? ? ?

## 2021-03-09 NOTE — Patient Instructions (Signed)
Health Maintenance for Postmenopausal Women ?Menopause is a normal process in which your ability to get pregnant comes to an end. This process happens slowly over many months or years, usually between the ages of 48 and 55. Menopause is complete when you have missed your menstrual period for 12 months. ?It is important to talk with your health care provider about some of the most common conditions that affect women after menopause (postmenopausal women). These include heart disease, cancer, and bone loss (osteoporosis). Adopting a healthy lifestyle and getting preventive care can help to promote your health and wellness. The actions you take can also lower your chances of developing some of these common conditions. ?What are the signs and symptoms of menopause? ?During menopause, you may have the following symptoms: ?Hot flashes. These can be moderate or severe. ?Night sweats. ?Decrease in sex drive. ?Mood swings. ?Headaches. ?Tiredness (fatigue). ?Irritability. ?Memory problems. ?Problems falling asleep or staying asleep. ?Talk with your health care provider about treatment options for your symptoms. ?Do I need hormone replacement therapy? ?Hormone replacement therapy is effective in treating symptoms that are caused by menopause, such as hot flashes and night sweats. ?Hormone replacement carries certain risks, especially as you become older. If you are thinking about using estrogen or estrogen with progestin, discuss the benefits and risks with your health care provider. ?How can I reduce my risk for heart disease and stroke? ?The risk of heart disease, heart attack, and stroke increases as you age. One of the causes may be a change in the body's hormones during menopause. This can affect how your body uses dietary fats, triglycerides, and cholesterol. Heart attack and stroke are medical emergencies. There are many things that you can do to help prevent heart disease and stroke. ?Watch your blood pressure ?High  blood pressure causes heart disease and increases the risk of stroke. This is more likely to develop in people who have high blood pressure readings or are overweight. ?Have your blood pressure checked: ?Every 3-5 years if you are 18-39 years of age. ?Every year if you are 40 years old or older. ?Eat a healthy diet ? ?Eat a diet that includes plenty of vegetables, fruits, low-fat dairy products, and lean protein. ?Do not eat a lot of foods that are high in solid fats, added sugars, or sodium. ?Get regular exercise ?Get regular exercise. This is one of the most important things you can do for your health. Most adults should: ?Try to exercise for at least 150 minutes each week. The exercise should increase your heart rate and make you sweat (moderate-intensity exercise). ?Try to do strengthening exercises at least twice each week. Do these in addition to the moderate-intensity exercise. ?Spend less time sitting. Even light physical activity can be beneficial. ?Other tips ?Work with your health care provider to achieve or maintain a healthy weight. ?Do not use any products that contain nicotine or tobacco. These products include cigarettes, chewing tobacco, and vaping devices, such as e-cigarettes. If you need help quitting, ask your health care provider. ?Know your numbers. Ask your health care provider to check your cholesterol and your blood sugar (glucose). Continue to have your blood tested as directed by your health care provider. ?Do I need screening for cancer? ?Depending on your health history and family history, you may need to have cancer screenings at different stages of your life. This may include screening for: ?Breast cancer. ?Cervical cancer. ?Lung cancer. ?Colorectal cancer. ?What is my risk for osteoporosis? ?After menopause, you may be   at increased risk for osteoporosis. Osteoporosis is a condition in which bone destruction happens more quickly than new bone creation. To help prevent osteoporosis or  the bone fractures that can happen because of osteoporosis, you may take the following actions: ?If you are 19-50 years old, get at least 1,000 mg of calcium and at least 600 international units (IU) of vitamin D per day. ?If you are older than age 50 but younger than age 70, get at least 1,200 mg of calcium and at least 600 international units (IU) of vitamin D per day. ?If you are older than age 70, get at least 1,200 mg of calcium and at least 800 international units (IU) of vitamin D per day. ?Smoking and drinking excessive alcohol increase the risk of osteoporosis. Eat foods that are rich in calcium and vitamin D, and do weight-bearing exercises several times each week as directed by your health care provider. ?How does menopause affect my mental health? ?Depression may occur at any age, but it is more common as you become older. Common symptoms of depression include: ?Feeling depressed. ?Changes in sleep patterns. ?Changes in appetite or eating patterns. ?Feeling an overall lack of motivation or enjoyment of activities that you previously enjoyed. ?Frequent crying spells. ?Talk with your health care provider if you think that you are experiencing any of these symptoms. ?General instructions ?See your health care provider for regular wellness exams and vaccines. This may include: ?Scheduling regular health, dental, and eye exams. ?Getting and maintaining your vaccines. These include: ?Influenza vaccine. Get this vaccine each year before the flu season begins. ?Pneumonia vaccine. ?Shingles vaccine. ?Tetanus, diphtheria, and pertussis (Tdap) booster vaccine. ?Your health care provider may also recommend other immunizations. ?Tell your health care provider if you have ever been abused or do not feel safe at home. ?Summary ?Menopause is a normal process in which your ability to get pregnant comes to an end. ?This condition causes hot flashes, night sweats, decreased interest in sex, mood swings, headaches, or lack  of sleep. ?Treatment for this condition may include hormone replacement therapy. ?Take actions to keep yourself healthy, including exercising regularly, eating a healthy diet, watching your weight, and checking your blood pressure and blood sugar levels. ?Get screened for cancer and depression. Make sure that you are up to date with all your vaccines. ?This information is not intended to replace advice given to you by your health care provider. Make sure you discuss any questions you have with your health care provider. ?Document Revised: 05/17/2020 Document Reviewed: 05/17/2020 ?Elsevier Patient Education ? 2022 [employer]vier Inc[employer] ? ?

## 2021-03-09 NOTE — Assessment & Plan Note (Signed)
Referral to nutritionist per patient request ?Encourage diet and exercise for weight ?

## 2021-03-10 ENCOUNTER — Other Ambulatory Visit: Payer: Self-pay

## 2021-03-10 DIAGNOSIS — Z1211 Encounter for screening for malignant neoplasm of colon: Secondary | ICD-10-CM

## 2021-03-10 LAB — COMPLETE METABOLIC PANEL WITH GFR
AG Ratio: 1.7 (calc) (ref 1.0–2.5)
ALT: 23 U/L (ref 6–29)
AST: 19 U/L (ref 10–35)
Albumin: 4.3 g/dL (ref 3.6–5.1)
Alkaline phosphatase (APISO): 50 U/L (ref 31–125)
BUN: 12 mg/dL (ref 7–25)
CO2: 27 mmol/L (ref 20–32)
Calcium: 9.7 mg/dL (ref 8.6–10.2)
Chloride: 104 mmol/L (ref 98–110)
Creat: 0.87 mg/dL (ref 0.50–0.99)
Globulin: 2.6 g/dL (calc) (ref 1.9–3.7)
Glucose, Bld: 109 mg/dL (ref 65–139)
Potassium: 4.4 mmol/L (ref 3.5–5.3)
Sodium: 139 mmol/L (ref 135–146)
Total Bilirubin: 0.5 mg/dL (ref 0.2–1.2)
Total Protein: 6.9 g/dL (ref 6.1–8.1)
eGFR: 82 mL/min/{1.73_m2} (ref >=60)

## 2021-03-10 LAB — CBC
HCT: 42.7 % (ref 35.0–45.0)
Hemoglobin: 14 g/dL (ref 11.7–15.5)
MCH: 29.5 pg (ref 27.0–33.0)
MCHC: 32.8 g/dL (ref 32.0–36.0)
MCV: 90.1 fL (ref 80.0–100.0)
MPV: 11 fL (ref 7.5–12.5)
Platelets: 283 10*3/uL (ref 140–400)
RBC: 4.74 10*6/uL (ref 3.80–5.10)
RDW: 12.3 % (ref 11.0–15.0)
WBC: 4.8 10*3/uL (ref 3.8–10.8)

## 2021-03-10 LAB — LIPID PANEL
Cholesterol: 207 mg/dL — ABNORMAL HIGH (ref <200)
HDL: 53 mg/dL (ref >=50)
LDL Cholesterol (Calc): 129 mg/dL (calc) — ABNORMAL HIGH
Non-HDL Cholesterol (Calc): 154 mg/dL (calc) — ABNORMAL HIGH (ref <130)
Total CHOL/HDL Ratio: 3.9 (calc) (ref <5.0)
Triglycerides: 134 mg/dL (ref <150)

## 2021-03-10 LAB — HEPATITIS C ANTIBODY
Hepatitis C Ab: NONREACTIVE
SIGNAL TO CUT-OFF: 0.03 (ref <1.00)

## 2021-03-10 MED ORDER — NA SULFATE-K SULFATE-MG SULF 17.5-3.13-1.6 GM/177ML PO SOLN: 1.0000 | Freq: Once | ORAL | 0 refills | Status: AC

## 2021-03-10 NOTE — Progress Notes (Signed)
Gastroenterology Pre-Procedure Review ? ?Request Date: 06/22/2021 ?Requesting Physician: Dr. Allegra Lai ? ? ?PATIENT REVIEW QUESTIONS: The patient responded to the following health history questions as indicated:   ? ?1. Are you having any GI issues? no ?2. Do you have a personal history of Polyps? no ?3. Do you have a family history of Colon Cancer or Polyps? yes (mom polyps) ?4. Diabetes Mellitus? no ?5. Joint replacements in the past 12 months?no ?6. Major health problems in the past 3 months?no ?7. Any artificial heart valves, MVP, or defibrillator?no ?   ?MEDICATIONS & ALLERGIES:    ?Patient reports the following regarding taking any anticoagulation/antiplatelet therapy:   ?Plavix, Coumadin, Eliquis, Xarelto, Lovenox, Pradaxa, Brilinta, or Effient? no ?Aspirin? no ? ?Patient confirms/reports the following medications:  ?Current Outpatient Medications  ?Medication Sig Dispense Refill  ? ibuprofen (ADVIL) 200 MG tablet Take 200 mg by mouth every 6 (six) hours as needed.    ? polyethylene glycol (MIRALAX / GLYCOLAX) 17 g packet Take 17 g by mouth daily.    ? ?No current facility-administered medications for this visit.  ? ? ?Patient confirms/reports the following allergies:  ?Allergies  ?Allergen Reactions  ? Augmentin [Amoxicillin-Pot Clavulanate] Diarrhea  ? ? ?No orders of the defined types were placed in this encounter. ? ? ?AUTHORIZATION INFORMATION ?Primary Insurance: ?1D#: ?Group #: ? ?Secondary Insurance: ?1D#: ?Group #: ? ?SCHEDULE INFORMATION: ?Date: 06/22/2021 ?Time: ?Location:[facility] ? ?

## 2021-03-14 ENCOUNTER — Encounter: Payer: Self-pay | Admitting: Internal Medicine

## 2021-04-10 ENCOUNTER — Ambulatory Visit
Admission: RE | Admit: 2021-04-10 | Discharge: 2021-04-10 | Disposition: A | Discharge status: Home / Self Care | Payer: 59 | Source: Ambulatory Visit | Attending: Family Medicine | Admitting: Family Medicine

## 2021-04-10 VITALS — BP 114/74 | HR 62 | Temp 97.8°F | Resp 18

## 2021-04-10 DIAGNOSIS — N760 Acute vaginitis: Secondary | ICD-10-CM | POA: Diagnosis present

## 2021-04-10 DIAGNOSIS — R350 Frequency of micturition: Secondary | ICD-10-CM | POA: Insufficient documentation

## 2021-04-10 LAB — POCT URINALYSIS DIP (MANUAL ENTRY)
Bilirubin, UA: NEGATIVE
Blood, UA: NEGATIVE
Glucose, UA: NEGATIVE mg/dL
Ketones, POC UA: NEGATIVE mg/dL
Nitrite, UA: NEGATIVE
Protein Ur, POC: NEGATIVE mg/dL
Spec Grav, UA: 1.01 (ref 1.010–1.025)
Urobilinogen, UA: 0.2 E.U./dL
pH, UA: 7 (ref 5.0–8.0)

## 2021-04-10 MED ORDER — FLUCONAZOLE 150 MG PO TABS: 150.0000 mg | ORAL_TABLET | ORAL | 0 refills | Status: DC | PRN

## 2021-04-10 NOTE — ED Provider Notes (Signed)
?[facility] ? ? ? ?CSN: [id number] ?Arrival date & time: 04/10/21  0934 ? ? ?  ? ?History   ?Chief Complaint ?Chief Complaint  ?Patient presents with  ? Urinary Frequency  ?  Symptoms of UTI. - Entered by patient  ? ? ?HPI ?Breanna Tran is a 51 y.o. female.  ? ?HPI ?Patient presents today with a 2-week history of urinary frequency.  Patient reports she is also had vaginitis symptoms for the same amount of time.  Endorses burning with urination.  She endorses that she is perimenopausal and is having some issue with vaginal dryness which she knows can cause some vaginal irritation. Denies fever, chills, nausea, or vomiting. ? ?Past Medical History:  ?Diagnosis Date  ? Genital warts due to HPV (human papillomavirus) 2000  ? wart removed.   ? Gestational diabetes   ? 2 nd pregnancy only  ? PONV (postoperative nausea and vomiting)   ? Strep throat   ? 10/18  ? ? ?Patient Active Problem List  ? Diagnosis Date Noted  ? Overweight with body mass index (BMI) of 28 to 28.9 in adult 07/28/2020  ? Psychophysiological insomnia 05/21/2017  ? Anxiety 05/21/2017  ? ? ?Past Surgical History:  ?Procedure Laterality Date  ? APPENDECTOMY  1993  ? COSMETIC SURGERY Bilateral   ? ears  ? PILONIDAL CYST EXCISION    ? SPHINCTEROTOMY N/A 12/15/2016  ? Procedure: CHEMICAL SPHINCTEROTOMY (BOTOX);  Surgeon: Romie Levee, MD;  Location: [facility];  Service: General;  Laterality: N/A;  ? WISDOM TOOTH EXTRACTION  1995  ? ? ?OB History   ? ? Gravida  ?4  ? Para  ?2  ? Term  ?1  ? Preterm  ?   ? AB  ?2  ? Living  ?   ?  ? ? SAB  ?2  ? IAB  ?   ? Ectopic  ?   ? Multiple  ?   ? Live Births  ?   ?   ?  ?  ? ? ? ?Home Medications   ? ?Prior to Admission medications   ?Medication Sig Start Date End Date Taking? Authorizing Provider  ?fluconazole (DIFLUCAN) 150 MG tablet Take 1 tablet (150 mg total) by mouth every three (3) days as needed. Repeat if needed 04/10/21  Yes Bing Neighbors, FNP  ?ibuprofen (ADVIL) 200 MG tablet  Take 200 mg by mouth every 6 (six) hours as needed.    [provider]  ?polyethylene glycol (MIRALAX / GLYCOLAX) 17 g packet Take 17 g by mouth daily.    [provider]  ? ? ?Family History ?Family History  ?Problem Relation Age of Onset  ? Cancer Father   ?     renal  ? Cancer Maternal Grandfather   ?     lung  ? Graves' disease Mother   ? Brain cancer Maternal Uncle   ? Breast cancer Neg Hx   ? ? ?Social History ?Social History  ? ?Tobacco Use  ? Smoking status: Former  ?  Packs/day: 0.50  ?  Years: 15.00  ?  Pack years: 7.50  ?  Types: E-cigarettes, Cigarettes  ?  Quit date: 01/10/2012  ?  Years since quitting: 9.2  ? Smokeless tobacco: Never  ?Vaping Use  ? Vaping Use: Some days  ?Substance Use Topics  ? Alcohol use: Yes  ?  Alcohol/week: 1.0 standard drink  ?  Types: 1 Glasses of wine per week  ?  Comment: occ  ? Drug use: No  ? ? ? ?Allergies   ?Augmentin [amoxicillin-pot clavulanate] ? ? ?Review of Systems ?Review of Systems ?Pertinent negatives listed in HPI ? ? ?Physical Exam ?Triage Vital Signs ?ED Triage Vitals [04/10/21 0953]  ?Enc Vitals Group  ?   BP 114/74  ?   Pulse Rate 62  ?   Resp 18  ?   Temp 97.8 ?F (36.6 ?C)  ?   Temp Source Oral  ?   SpO2 97 %  ?   Weight   ?   Height   ?   Head Circumference   ?   Peak Flow   ?   Pain Score 0  ?   Pain Loc   ?   Pain Edu?   ?   Excl. in GC?   ? ?No data found. ? ?Updated Vital Signs ?BP 114/74 (BP Location: Left Arm)   Pulse 62   Temp 97.8 ?F (36.6 ?C) (Oral)   Resp 18   SpO2 97%  ? ?Visual Acuity ?Right Eye Distance:   ?Left Eye Distance:   ?Bilateral Distance:   ? ?Right Eye Near:   ?Left Eye Near:    ?Bilateral Near:    ? ?Physical Exam ?General appearance: Alert, well developed, well nourished, cooperative  ?Head: Normocephalic, without obvious abnormality, atraumatic ?Respiratory: Respirations even and unlabored, normal respiratory rate ?Heart: rate and rhythm normal. ?Extremities: No gross deformities ?Skin: Skin color, texture,  turgor normal. No rashes seen  ?Psych: Appropriate mood and affect. ?Vaginal Cytology self-collected. ? ?UC Treatments / Results  ?Labs ?(all labs ordered are listed, but only abnormal results are displayed) ?Labs Reviewed  ?POCT URINALYSIS DIP (MANUAL ENTRY) - Abnormal; Notable for the following components:  ?    Result Value  ? Leukocytes, UA Trace (*)   ? All other components within normal limits  ?URINE CULTURE  ?CERVICOVAGINAL ANCILLARY ONLY  ? ? ?EKG ? ? ?Radiology ?No results found. ? ?Procedures ?Procedures (including critical care time) ? ?Medications Ordered in UC ?Medications - No data to display ? ?Initial Impression / Assessment and Plan / UC Course  ?I have reviewed the triage vital signs and the nursing notes. ? ?Pertinent labs & imaging results that were available during my care of the patient were reviewed by me and considered in my medical decision making (see chart for details). ? ?  ?Urine frequency, UA unremarkable. Given symptoms will culture urine. Will trial diflucan for vaginitis symptoms. Vaginal Cytology pending. Strict return precautions given. ?Final Clinical Impressions(s) / UC Diagnoses  ? ?Final diagnoses:  ?Urine frequency  ?Vaginitis and vulvovaginitis  ? ? ? ?Discharge Instructions   ? ?  ?Vaginal cytology will result within 2 to 3 days.  In the meantime start Diflucan take 1 tablet today and repeat in 3 days.  Your urine analysis here in clinic is insignificant for that of a UTI however I will submit a urine culture to rule out any infection that is unable to be seen here in clinic.  If your urine frequency despite treatment for vaginitis continues follow-up with your primary care provider. ? ? ?ED Prescriptions   ? ? Medication Sig Dispense Auth. Provider  ? fluconazole (DIFLUCAN) 150 MG tablet Take 1 tablet (150 mg total) by mouth every three (3) days as needed. Repeat if needed 2 tablet Bing Neighbors, FNP  ? ?  ? ?PDMP not reviewed this encounter. ?  ?Bing Neighbors,  FNP ?04/15/21 1009 ? ?

## 2021-04-10 NOTE — Discharge Instructions (Addendum)
Vaginal cytology will result within 2 to 3 days.  In the meantime start Diflucan take 1 tablet today and repeat in 3 days.  Your urine analysis here in clinic is insignificant for that of a UTI however I will submit a urine culture to rule out any infection that is unable to be seen here in clinic.  If your urine frequency despite treatment for vaginitis continues follow-up with your primary care provider. ?

## 2021-04-10 NOTE — ED Triage Notes (Signed)
Pt presents with urinary frequency x 2 weeks. She also thinks she may have a yeast infection due to some vaginal irritation.  ?

## 2021-04-11 ENCOUNTER — Encounter: Payer: Self-pay | Admitting: Internal Medicine

## 2021-04-11 LAB — CERVICOVAGINAL ANCILLARY ONLY
Bacterial Vaginitis (gardnerella): NEGATIVE
Candida Glabrata: NEGATIVE
Candida Vaginitis: NEGATIVE
Comment: NEGATIVE
Comment: NEGATIVE
Comment: NEGATIVE

## 2021-04-11 LAB — URINE CULTURE
Culture: <10000 — AB
Special Requests: NORMAL

## 2021-04-11 MED ORDER — PREMARIN 0.625 MG/GM VA CREA: 1.0000 | TOPICAL_CREAM | VAGINAL | 0 refills | Status: DC

## 2021-05-10 ENCOUNTER — Telehealth: Payer: Self-pay

## 2021-05-10 ENCOUNTER — Telehealth: Payer: Self-pay | Admitting: Gastroenterology

## 2021-05-10 NOTE — Telephone Encounter (Signed)
Rescheduled patient called endo sent new letters and new referral  ?

## 2021-05-10 NOTE — Telephone Encounter (Signed)
Pt left message to cancel colonoscopy  on 6/14 and reschedule for sometime in Nov. ?

## 2021-11-16 ENCOUNTER — Ambulatory Visit: Admit: 2021-11-16 | Payer: 59 | Admitting: Gastroenterology

## 2021-11-16 SURGERY — COLONOSCOPY WITH PROPOFOL
Anesthesia: General

## 2022-01-18 ENCOUNTER — Encounter: Payer: Self-pay | Admitting: Internal Medicine

## 2022-01-18 MED ORDER — HYDROCORTISONE ACETATE 25 MG RE SUPP: 25.0000 mg | Freq: Two times a day (BID) | RECTAL | 0 refills | Status: DC

## 2022-02-27 ENCOUNTER — Other Ambulatory Visit: Payer: Self-pay | Admitting: Internal Medicine

## 2022-02-27 DIAGNOSIS — Z1231 Encounter for screening mammogram for malignant neoplasm of breast: Secondary | ICD-10-CM

## 2022-03-15 ENCOUNTER — Telehealth: Payer: Self-pay

## 2022-03-15 NOTE — Telephone Encounter (Signed)
Left message advising pt that the Hep B that was ordered 12/2020 was a Qualitative not Quantitative.    Thanks,   -Mickel Baas

## 2022-03-15 NOTE — Telephone Encounter (Signed)
Copied from [place] [id number]. Topic: General - Other >> Mar 14, 2022  4:44 PM Breanna Tran wrote: Reason for CRM: The patient has called to request a copy of their hepatitis titer that includes a numeric value of their antibodies  Please contact the patient further when possible

## 2022-03-25 ENCOUNTER — Encounter: Payer: Self-pay | Admitting: Internal Medicine

## 2022-04-03 ENCOUNTER — Encounter: Payer: Self-pay | Admitting: Internal Medicine

## 2022-04-03 ENCOUNTER — Ambulatory Visit (INDEPENDENT_AMBULATORY_CARE_PROVIDER_SITE_OTHER): Payer: BC Managed Care – PPO | Admitting: Internal Medicine

## 2022-04-03 VITALS — BP 112/76 | HR 64 | Temp 96.9°F | Ht 68.0 in | Wt 176.0 lb

## 2022-04-03 DIAGNOSIS — R7309 Other abnormal glucose: Secondary | ICD-10-CM

## 2022-04-03 DIAGNOSIS — Z6826 Body mass index (BMI) 26.0-26.9, adult: Secondary | ICD-10-CM

## 2022-04-03 DIAGNOSIS — Z9229 Personal history of other drug therapy: Secondary | ICD-10-CM | POA: Diagnosis not present

## 2022-04-03 DIAGNOSIS — E7800 Pure hypercholesterolemia, unspecified: Secondary | ICD-10-CM

## 2022-04-03 DIAGNOSIS — Z23 Encounter for immunization: Secondary | ICD-10-CM

## 2022-04-03 DIAGNOSIS — E663 Overweight: Secondary | ICD-10-CM

## 2022-04-03 DIAGNOSIS — Z0001 Encounter for general adult medical examination with abnormal findings: Secondary | ICD-10-CM

## 2022-04-03 NOTE — Assessment & Plan Note (Signed)
Encourage diet and exercise for weight loss 

## 2022-04-03 NOTE — Progress Notes (Signed)
Subjective:    Patient ID: Breanna Tran, female    DOB: 11/03/1970, 52 y.o.   MRN: [id number]  HPI  Patient presents to clinic today for annual exam.  Flu: Never Tetanus: 01/2007 COVID: Never Shingrix: Never Pap smear: 06/2017 Mammogram: 11/2020, schedule 04/2022 Colon screening: scheduled in September 2024 Vision screening: annually Dentist: biannually  Diet: She rarely eats meat. She consumes fruits and veggies. She tries to avoid fried foods. She drinks mostly water and coffee. Exercise: Walking  Review of Systems     Past Medical History:  Diagnosis Date   Genital warts due to HPV (human papillomavirus) 2000   wart removed.    Gestational diabetes    2 nd pregnancy only   PONV (postoperative nausea and vomiting)    Strep throat    10/18    Current Outpatient Medications  Medication Sig Dispense Refill   conjugated estrogens (PREMARIN) vaginal cream Place 1 Applicatorful vaginally 3 (three) times a week. 30 g 0   fluconazole (DIFLUCAN) 150 MG tablet Take 1 tablet (150 mg total) by mouth every three (3) days as needed. Repeat if needed 2 tablet 0   hydrocortisone (ANUSOL-HC) 25 MG suppository Place 1 suppository (25 mg total) rectally 2 (two) times daily. For 7 days 14 suppository 0   ibuprofen (ADVIL) 200 MG tablet Take 200 mg by mouth every 6 (six) hours as needed.     polyethylene glycol (MIRALAX / GLYCOLAX) 17 g packet Take 17 g by mouth daily.     No current facility-administered medications for this visit.    Allergies  Allergen Reactions   Augmentin [Amoxicillin-Pot Clavulanate] Diarrhea    Family History  Problem Relation Age of Onset   Cancer Father        renal   Cancer Maternal Grandfather        lung   Graves' disease Mother    Brain cancer Maternal Uncle    Breast cancer Neg Hx     Social History   Socioeconomic History   Marital status: Married    Spouse name: Not on file   Number of children: Not on file   Years of education: Not  on file   Highest education level: Not on file  Occupational History   Not on file  Tobacco Use   Smoking status: Former    Packs/day: 0.50    Years: 15.00    Additional pack years: 0.00    Total pack years: 7.50    Types: E-cigarettes, Cigarettes    Quit date: 01/10/2012    Years since quitting: 10.2   Smokeless tobacco: Never  Vaping Use   Vaping Use: Some days  Substance and Sexual Activity   Alcohol use: Yes    Alcohol/week: 1.0 standard drink of alcohol    Types: 1 Glasses of wine per week    Comment: occ   Drug use: No   Sexual activity: Yes    Birth control/protection: Condom  Other Topics Concern   Not on file  Social History Narrative   Not on file   Social Determinants of Health   Financial Resource Strain: Not on file  Food Insecurity: Not on file  Transportation Needs: Not on file  Physical Activity: Not on file  Stress: Not on file  Social Connections: Not on file  Intimate Partner Violence: Not on file     Constitutional: Denies fever, malaise, fatigue, headache or abrupt weight changes.  HEENT: Denies eye pain, eye redness, ear pain, ringing  in the ears, wax buildup, runny nose, nasal congestion, bloody nose, or sore throat. Respiratory: Denies difficulty breathing, shortness of breath, cough or sputum production.   Cardiovascular: Denies chest pain, chest tightness, palpitations or swelling in the hands or feet.  Gastrointestinal: Pt reports intermittent reflux. Denies abdominal pain, bloating, constipation, diarrhea or blood in the stool.  GU: Patient reports vaginal dryness.  Denies urgency, frequency, pain with urination, burning sensation, blood in urine, odor or discharge. Musculoskeletal: Denies decrease in range of motion, difficulty with gait, muscle pain or joint pain and swelling.  Skin: Denies redness, rashes, lesions or ulcercations.  Neurological: Patient reports insomnia.  Denies dizziness, difficulty with memory, difficulty with speech or  problems with balance and coordination.  Psych: Patient has a history of anxiety.  Denies depression, SI/HI.  No other specific complaints in a complete review of systems (except as listed in HPI above).  Objective:   Physical Exam   BP 112/76 (BP Location: Left Arm, Patient Position: Sitting, Cuff Size: Normal)   Pulse 64   Temp (!) 96.9 F (36.1 C) (Temporal)   Ht 5\' 8"  (1.727 m)   Wt 176 lb (79.8 kg)   SpO2 100%   BMI 26.76 kg/m   Wt Readings from Last 3 Encounters:  03/09/21 187 lb (84.8 kg)  09/30/20 174 lb 12.8 oz (79.3 kg)  07/28/20 176 lb 3.2 oz (79.9 kg)    General: Appears her stated age, overweight, in NAD. Skin: Warm, dry and intact.  HEENT: Head: normal shape and size; Eyes: sclera white, no icterus, conjunctiva pink, PERRLA and EOMs intact;  Neck:  Neck supple, trachea midline. No masses, lumps or thyromegaly present.  Cardiovascular: Normal rate and rhythm. S1,S2 noted.  No murmur, rubs or gallops noted. No JVD or BLE edema.  Pulmonary/Chest: Normal effort and positive vesicular breath sounds. No respiratory distress. No wheezes, rales or ronchi noted.  Abdomen: Normal bowel sounds.  Musculoskeletal: Strength 5/5 BUE/BLE.  No difficulty with gait.  Neurological: Alert and oriented. Cranial nerves II-XII grossly intact. Coordination normal.  Psychiatric: Mood and affect normal. Behavior is normal. Judgment and thought content normal.    BMET    Component Value Date/Time   NA 139 03/09/2021 0825   NA 142 01/22/2020 1121   K 4.4 03/09/2021 0825   CL 104 03/09/2021 0825   CO2 27 03/09/2021 0825   GLUCOSE 109 03/09/2021 0825   BUN 12 03/09/2021 0825   BUN 9 01/22/2020 1121   CREATININE 0.87 03/09/2021 0825   CALCIUM 9.7 03/09/2021 0825   GFRNONAA 91 01/22/2020 1121   GFRAA 105 01/22/2020 1121    Lipid Panel     Component Value Date/Time   CHOL 207 (H) 03/09/2021 0825   TRIG 134 03/09/2021 0825   HDL 53 03/09/2021 0825   CHOLHDL 3.9 03/09/2021 0825    VLDL 11.0 06/18/2017 1102   LDLCALC 129 (H) 03/09/2021 0825    CBC    Component Value Date/Time   WBC 4.8 03/09/2021 0825   RBC 4.74 03/09/2021 0825   HGB 14.0 03/09/2021 0825   HGB 14.6 01/22/2020 1124   HCT 42.7 03/09/2021 0825   HCT 42.7 01/22/2020 1124   PLT 283 03/09/2021 0825   PLT 264 01/22/2020 1124   MCV 90.1 03/09/2021 0825   MCV 89 01/22/2020 1124   MCH 29.5 03/09/2021 0825   MCHC 32.8 03/09/2021 0825   RDW 12.3 03/09/2021 0825   RDW 12.3 01/22/2020 1124    Hgb A1C Lab Results  Component Value Date   HGBA1C 5.2 09/30/2020           Assessment & Plan:  Preventative Health Maintenance:  Encouraged her to get a flu shot in the fall She declines tetanus today Encouraged her to get a COVID-vaccine Discussed Shingrix vaccine, she will check coverage with her insurance company schedule right nurse visit if she would like to have this done She declines Pap smear today, would like to wait till her next appointment Colonoscopy has already been scheduled Encouraged her to consume a balanced diet and exercise regimen Advised her seeing eye doctor and dentist annually Will check CBC, c-Met, lipid, A1c today  RTC in 6 months, follow-up chronic conditions Webb Silversmith, NP

## 2022-04-03 NOTE — Patient Instructions (Signed)
Health Maintenance for Postmenopausal Women Menopause is a normal process in which your ability to get pregnant comes to an end. This process happens slowly over many months or years, usually between the ages of 48 and 55. Menopause is complete when you have missed your menstrual period for 12 months. It is important to talk with your health care provider about some of the most common conditions that affect women after menopause (postmenopausal women). These include heart disease, cancer, and bone loss (osteoporosis). Adopting a healthy lifestyle and getting preventive care can help to promote your health and wellness. The actions you take can also lower your chances of developing some of these common conditions. What are the signs and symptoms of menopause? During menopause, you may have the following symptoms: Hot flashes. These can be moderate or severe. Night sweats. Decrease in sex drive. Mood swings. Headaches. Tiredness (fatigue). Irritability. Memory problems. Problems falling asleep or staying asleep. Talk with your health care provider about treatment options for your symptoms. Do I need hormone replacement therapy? Hormone replacement therapy is effective in treating symptoms that are caused by menopause, such as hot flashes and night sweats. Hormone replacement carries certain risks, especially as you become older. If you are thinking about using estrogen or estrogen with progestin, discuss the benefits and risks with your health care provider. How can I reduce my risk for heart disease and stroke? The risk of heart disease, heart attack, and stroke increases as you age. One of the causes may be a change in the body's hormones during menopause. This can affect how your body uses dietary fats, triglycerides, and cholesterol. Heart attack and stroke are medical emergencies. There are many things that you can do to help prevent heart disease and stroke. Watch your blood pressure High  blood pressure causes heart disease and increases the risk of stroke. This is more likely to develop in people who have high blood pressure readings or are overweight. Have your blood pressure checked: Every 3-5 years if you are 18-39 years of age. Every year if you are 40 years old or older. Eat a healthy diet  Eat a diet that includes plenty of vegetables, fruits, low-fat dairy products, and lean protein. Do not eat a lot of foods that are high in solid fats, added sugars, or sodium. Get regular exercise Get regular exercise. This is one of the most important things you can do for your health. Most adults should: Try to exercise for at least 150 minutes each week. The exercise should increase your heart rate and make you sweat (moderate-intensity exercise). Try to do strengthening exercises at least twice each week. Do these in addition to the moderate-intensity exercise. Spend less time sitting. Even light physical activity can be beneficial. Other tips Work with your health care provider to achieve or maintain a healthy weight. Do not use any products that contain nicotine or tobacco. These products include cigarettes, chewing tobacco, and vaping devices, such as e-cigarettes. If you need help quitting, ask your health care provider. Know your numbers. Ask your health care provider to check your cholesterol and your blood sugar (glucose). Continue to have your blood tested as directed by your health care provider. Do I need screening for cancer? Depending on your health history and family history, you may need to have cancer screenings at different stages of your life. This may include screening for: Breast cancer. Cervical cancer. Lung cancer. Colorectal cancer. What is my risk for osteoporosis? After menopause, you may be   at increased risk for osteoporosis. Osteoporosis is a condition in which bone destruction happens more quickly than new bone creation. To help prevent osteoporosis or  the bone fractures that can happen because of osteoporosis, you may take the following actions: If you are 19-50 years old, get at least 1,000 mg of calcium and at least 600 international units (IU) of vitamin D per day. If you are older than age 50 but younger than age 70, get at least 1,200 mg of calcium and at least 600 international units (IU) of vitamin D per day. If you are older than age 70, get at least 1,200 mg of calcium and at least 800 international units (IU) of vitamin D per day. Smoking and drinking excessive alcohol increase the risk of osteoporosis. Eat foods that are rich in calcium and vitamin D, and do weight-bearing exercises several times each week as directed by your health care provider. How does menopause affect my mental health? Depression may occur at any age, but it is more common as you become older. Common symptoms of depression include: Feeling depressed. Changes in sleep patterns. Changes in appetite or eating patterns. Feeling an overall lack of motivation or enjoyment of activities that you previously enjoyed. Frequent crying spells. Talk with your health care provider if you think that you are experiencing any of these symptoms. General instructions See your health care provider for regular wellness exams and vaccines. This may include: Scheduling regular health, dental, and eye exams. Getting and maintaining your vaccines. These include: Influenza vaccine. Get this vaccine each year before the flu season begins. Pneumonia vaccine. Shingles vaccine. Tetanus, diphtheria, and pertussis (Tdap) booster vaccine. Your health care provider may also recommend other immunizations. Tell your health care provider if you have ever been abused or do not feel safe at home. Summary Menopause is a normal process in which your ability to get pregnant comes to an end. This condition causes hot flashes, night sweats, decreased interest in sex, mood swings, headaches, or lack  of sleep. Treatment for this condition may include hormone replacement therapy. Take actions to keep yourself healthy, including exercising regularly, eating a healthy diet, watching your weight, and checking your blood pressure and blood sugar levels. Get screened for cancer and depression. Make sure that you are up to date with all your vaccines. This information is not intended to replace advice given to you by your health care provider. Make sure you discuss any questions you have with your health care provider. Document Revised: 05/17/2020 Document Reviewed: 05/17/2020 Elsevier Patient Education  2023 [employer] Inc.  

## 2022-04-04 LAB — COMPLETE METABOLIC PANEL WITH GFR
AG Ratio: 1.6 (calc) (ref 1.0–2.5)
ALT: 16 U/L (ref 6–29)
AST: 14 U/L (ref 10–35)
Albumin: 4.5 g/dL (ref 3.6–5.1)
Alkaline phosphatase (APISO): 62 U/L (ref 37–153)
BUN: 13 mg/dL (ref 7–25)
CO2: 28 mmol/L (ref 20–32)
Calcium: 10.1 mg/dL (ref 8.6–10.4)
Chloride: 105 mmol/L (ref 98–110)
Creat: 0.81 mg/dL (ref 0.50–1.03)
Globulin: 2.9 g/dL (calc) (ref 1.9–3.7)
Glucose, Bld: 94 mg/dL (ref 65–139)
Potassium: 4.6 mmol/L (ref 3.5–5.3)
Sodium: 142 mmol/L (ref 135–146)
Total Bilirubin: 0.3 mg/dL (ref 0.2–1.2)
Total Protein: 7.4 g/dL (ref 6.1–8.1)
eGFR: 88 mL/min/{1.73_m2} (ref >=60)

## 2022-04-04 LAB — HEMOGLOBIN A1C
Hgb A1c MFr Bld: 5.8 % of total Hgb — ABNORMAL HIGH (ref <5.7)
Mean Plasma Glucose: 120 mg/dL
eAG (mmol/L): 6.6 mmol/L

## 2022-04-04 LAB — CBC
HCT: 41.7 % (ref 35.0–45.0)
Hemoglobin: 13.9 g/dL (ref 11.7–15.5)
MCH: 29.4 pg (ref 27.0–33.0)
MCHC: 33.3 g/dL (ref 32.0–36.0)
MCV: 88.2 fL (ref 80.0–100.0)
MPV: 11.1 fL (ref 7.5–12.5)
Platelets: 298 10*3/uL (ref 140–400)
RBC: 4.73 10*6/uL (ref 3.80–5.10)
RDW: 12.6 % (ref 11.0–15.0)
WBC: 7.4 10*3/uL (ref 3.8–10.8)

## 2022-04-04 LAB — LIPID PANEL
Cholesterol: 242 mg/dL — ABNORMAL HIGH (ref <200)
HDL: 56 mg/dL (ref >=50)
LDL Cholesterol (Calc): 144 mg/dL (calc) — ABNORMAL HIGH
Non-HDL Cholesterol (Calc): 186 mg/dL (calc) — ABNORMAL HIGH (ref <130)
Total CHOL/HDL Ratio: 4.3 (calc) (ref <5.0)
Triglycerides: 275 mg/dL — ABNORMAL HIGH (ref <150)

## 2022-04-04 LAB — HEPATITIS B SURFACE ANTIBODY, QUANTITATIVE: Hep B S AB Quant (Post): 76 m[IU]/mL (ref >=10)

## 2022-05-05 ENCOUNTER — Encounter: Payer: Self-pay | Admitting: Internal Medicine

## 2022-05-05 ENCOUNTER — Ambulatory Visit
Admission: RE | Admit: 2022-05-05 | Discharge: 2022-05-05 | Disposition: A | Discharge status: Home / Self Care | Payer: BC Managed Care – PPO | Source: Ambulatory Visit | Attending: Internal Medicine | Admitting: Internal Medicine

## 2022-05-05 DIAGNOSIS — Z1231 Encounter for screening mammogram for malignant neoplasm of breast: Secondary | ICD-10-CM | POA: Diagnosis not present

## 2022-06-15 ENCOUNTER — Encounter: Payer: Self-pay | Admitting: Internal Medicine

## 2022-06-15 DIAGNOSIS — Z1331 Encounter for screening for depression: Secondary | ICD-10-CM | POA: Diagnosis not present

## 2022-06-15 DIAGNOSIS — K6289 Other specified diseases of anus and rectum: Secondary | ICD-10-CM | POA: Diagnosis not present

## 2022-07-17 DIAGNOSIS — J029 Acute pharyngitis, unspecified: Secondary | ICD-10-CM | POA: Diagnosis not present

## 2022-07-17 DIAGNOSIS — J069 Acute upper respiratory infection, unspecified: Secondary | ICD-10-CM | POA: Diagnosis not present

## 2022-08-24 ENCOUNTER — Ambulatory Visit
Admission: RE | Admit: 2022-08-24 | Discharge: 2022-08-24 | Disposition: A | Discharge status: Home / Self Care | Payer: BC Managed Care – PPO | Source: Ambulatory Visit | Attending: Emergency Medicine | Admitting: Emergency Medicine

## 2022-08-24 VITALS — BP 112/74 | HR 67 | Temp 99.1°F | Resp 18

## 2022-08-24 DIAGNOSIS — U071 COVID-19: Secondary | ICD-10-CM | POA: Insufficient documentation

## 2022-08-24 DIAGNOSIS — B349 Viral infection, unspecified: Secondary | ICD-10-CM

## 2022-08-24 NOTE — Discharge Instructions (Addendum)
Your COVID test is pending.    Take Tylenol as needed for fever or discomfort.  Rest and keep yourself hydrated.    Follow-up with your primary care provider if your symptoms are not improving.     

## 2022-08-24 NOTE — ED Triage Notes (Signed)
Patient to Urgent Care with complaints of nasal congestion, chest congestion, generalized body aches. Denies any fevers.   Symptoms started yesterday. Woke up this morning feeling worse. Requests Covid testing for work.  Taking Mucinex/ ibuprofen.

## 2022-08-24 NOTE — ED Provider Notes (Signed)
Breanna Tran    CSN: [id number] Arrival date & time: 08/24/22  0803      History   Chief Complaint Chief Complaint  Patient presents with   Nasal Congestion    Chest congestion, body aches and malaise. - Entered by patient    HPI Breanna Tran is a 52 y.o. female.  Patient presents with 1 day history of bodyaches, congestion, cough.  She is concerned for COVID.  Treating symptoms with Mucinex and ibuprofen.  No fever, shortness of breath, or other symptoms.  Her medical history includes overweight, former smoker, hypercholesterolemia.  The history is provided by the patient and medical records.    Past Medical History:  Diagnosis Date   Genital warts due to HPV (human papillomavirus) 2000   wart removed.    Gestational diabetes    2 nd pregnancy only   PONV (postoperative nausea and vomiting)    Strep throat    10/18    Patient Active Problem List   Diagnosis Date Noted   Pure hypercholesterolemia 04/03/2022   Overweight with body mass index (BMI) of 26 to 26.9 in adult 07/28/2020   Psychophysiological insomnia 05/21/2017   Anxiety 05/21/2017    Past Surgical History:  Procedure Laterality Date   APPENDECTOMY  1993   COSMETIC SURGERY Bilateral    ears   PILONIDAL CYST EXCISION     SPHINCTEROTOMY N/A 12/15/2016   Procedure: CHEMICAL SPHINCTEROTOMY (BOTOX);  Surgeon: Romie Levee, MD;  Location: [facility];  Service: General;  Laterality: N/A;   WISDOM TOOTH EXTRACTION  1995    OB History     Gravida  4   Para  2   Term  1   Preterm      AB  2   Living         SAB  2   IAB      Ectopic      Multiple      Live Births               Home Medications    Prior to Admission medications   Medication Sig Start Date End Date Taking? Authorizing Provider  conjugated estrogens (PREMARIN) vaginal cream Place 1 Applicatorful vaginally 3 (three) times a week. 04/11/21   Lorre Munroe, NP  fluconazole (DIFLUCAN) 150  MG tablet Take 1 tablet (150 mg total) by mouth every three (3) days as needed. Repeat if needed 04/10/21   Bing Neighbors, NP  hydrocortisone (ANUSOL-HC) 25 MG suppository Place 1 suppository (25 mg total) rectally 2 (two) times daily. For 7 days 01/18/22   Lorre Munroe, NP  ibuprofen (ADVIL) 200 MG tablet Take 200 mg by mouth every 6 (six) hours as needed.    [provider]  polyethylene glycol (MIRALAX / GLYCOLAX) 17 g packet Take 17 g by mouth daily.    [provider]    Family History Family History  Problem Relation Age of Onset   Graves' disease Mother    Renal cancer Father    Healthy Sister    Lung cancer Maternal Grandfather    Brain cancer Maternal Uncle    Breast cancer Neg Hx     Social History Social History   Tobacco Use   Smoking status: Former    Current packs/day: 0.00    Average packs/day: 0.5 packs/day for 15.0 years (7.5 ttl pk-yrs)    Types: E-cigarettes, Cigarettes    Start date: 01/09/1997    Quit  date: 01/10/2012    Years since quitting: 10.6   Smokeless tobacco: Never  Vaping Use   Vaping status: Former  Substance Use Topics   Alcohol use: Yes    Alcohol/week: 1.0 standard drink of alcohol    Types: 1 Glasses of wine per week    Comment: occ   Drug use: No     Allergies   Augmentin [amoxicillin-pot clavulanate]   Review of Systems Review of Systems  Constitutional:  Positive for fatigue. Negative for chills and fever.  HENT:  Positive for congestion. Negative for ear pain and sore throat.   Respiratory:  Positive for cough. Negative for shortness of breath.   Cardiovascular:  Negative for chest pain and palpitations.  Gastrointestinal:  Negative for diarrhea and vomiting.     Physical Exam Triage Vital Signs ED Triage Vitals  Encounter Vitals Group     BP      Systolic BP Percentile      Diastolic BP Percentile      Pulse      Resp      Temp      Temp src      SpO2      Weight      Height      Head  Circumference      Peak Flow      Pain Score      Pain Loc      Pain Education      Exclude from Growth Chart    No data found.  Updated Vital Signs BP 112/74   Pulse 67   Temp 99.1 F (37.3 C)   Resp 18   LMP 11/20/2019 (Approximate)   SpO2 98%   Visual Acuity Right Eye Distance:   Left Eye Distance:   Bilateral Distance:    Right Eye Near:   Left Eye Near:    Bilateral Near:     Physical Exam Constitutional:      General: She is not in acute distress.    Appearance: She is obese.  HENT:     Right Ear: Tympanic membrane normal.     Left Ear: Tympanic membrane normal.     Nose: Nose normal.     Mouth/Throat:     Mouth: Mucous membranes are moist.     Pharynx: Oropharynx is clear.  Cardiovascular:     Rate and Rhythm: Normal rate and regular rhythm.     Heart sounds: Normal heart sounds.  Pulmonary:     Effort: Pulmonary effort is normal. No respiratory distress.     Breath sounds: Normal breath sounds.  Skin:    General: Skin is warm and dry.  Neurological:     Mental Status: She is alert.      UC Treatments / Results  Labs (all labs ordered are listed, but only abnormal results are displayed) Labs Reviewed  SARS CORONAVIRUS 2 (TAT 6-24 HRS)    EKG   Radiology No results found.  Procedures Procedures (including critical care time)  Medications Ordered in UC Medications - No data to display  Initial Impression / Assessment and Plan / UC Course  I have reviewed the triage vital signs and the nursing notes.  Pertinent labs & imaging results that were available during my care of the patient were reviewed by me and considered in my medical decision making (see chart for details).    Viral illness.  COVID pending.  Patient is overweight, a former smoker, has high cholesterol.  If COVID  positive, recommend treatment with Paxlovid.  GFR 88 on 04/03/2022.  Discussed Paxlovid, including side effects, possibility for rebound COVID, and that it is an  emergency authorized medication.  Discussed symptomatic treatment including Tylenol, rest, hydration.  Instructed patient to follow up with PCP if symptoms are not improving.  ED precautions given.  She agrees to plan of care.    Final Clinical Impressions(s) / UC Diagnoses   Final diagnoses:  Viral illness     Discharge Instructions      Your COVID test is pending.    Take Tylenol as needed for fever or discomfort.  Rest and keep yourself hydrated.    Follow-up with your primary care provider if your symptoms are not improving.         ED Prescriptions   None    PDMP not reviewed this encounter.   Mickie Bail, NP 08/24/22 [phone]

## 2022-08-25 ENCOUNTER — Telehealth (HOSPITAL_COMMUNITY): Payer: Self-pay | Admitting: Emergency Medicine

## 2022-08-25 LAB — SARS CORONAVIRUS 2 (TAT 6-24 HRS): SARS Coronavirus 2: POSITIVE — AB

## 2022-08-25 MED ORDER — NIRMATRELVIR/RITONAVIR (PAXLOVID)TABLET: 3.0000 | ORAL_TABLET | Freq: Two times a day (BID) | ORAL | 0 refills | Status: AC

## 2022-09-14 DIAGNOSIS — K642 Third degree hemorrhoids: Secondary | ICD-10-CM | POA: Diagnosis not present

## 2022-09-14 DIAGNOSIS — Z23 Encounter for immunization: Secondary | ICD-10-CM | POA: Diagnosis not present

## 2022-09-14 DIAGNOSIS — Z1211 Encounter for screening for malignant neoplasm of colon: Secondary | ICD-10-CM | POA: Diagnosis not present

## 2022-09-14 DIAGNOSIS — K5909 Other constipation: Secondary | ICD-10-CM | POA: Diagnosis not present

## 2022-10-04 ENCOUNTER — Ambulatory Visit: Payer: BC Managed Care – PPO | Admitting: Internal Medicine

## 2022-10-04 ENCOUNTER — Other Ambulatory Visit (HOSPITAL_COMMUNITY)
Admission: RE | Admit: 2022-10-04 | Discharge: 2022-10-04 | Disposition: A | Discharge status: Home / Self Care | Payer: BC Managed Care – PPO | Source: Ambulatory Visit | Attending: Internal Medicine | Admitting: Internal Medicine

## 2022-10-04 ENCOUNTER — Encounter: Payer: Self-pay | Admitting: Internal Medicine

## 2022-10-04 VITALS — BP 106/62 | HR 56 | Temp 96.0°F | Wt 189.0 lb

## 2022-10-04 DIAGNOSIS — Z6828 Body mass index (BMI) 28.0-28.9, adult: Secondary | ICD-10-CM

## 2022-10-04 DIAGNOSIS — Z124 Encounter for screening for malignant neoplasm of cervix: Secondary | ICD-10-CM | POA: Insufficient documentation

## 2022-10-04 DIAGNOSIS — F419 Anxiety disorder, unspecified: Secondary | ICD-10-CM

## 2022-10-04 DIAGNOSIS — Z01419 Encounter for gynecological examination (general) (routine) without abnormal findings: Secondary | ICD-10-CM | POA: Diagnosis not present

## 2022-10-04 DIAGNOSIS — E7800 Pure hypercholesterolemia, unspecified: Secondary | ICD-10-CM | POA: Diagnosis not present

## 2022-10-04 DIAGNOSIS — R7303 Prediabetes: Secondary | ICD-10-CM | POA: Diagnosis not present

## 2022-10-04 DIAGNOSIS — E663 Overweight: Secondary | ICD-10-CM

## 2022-10-04 DIAGNOSIS — F5104 Psychophysiologic insomnia: Secondary | ICD-10-CM

## 2022-10-04 MED ORDER — PHENTERMINE HCL 15 MG PO CAPS: 15.0000 mg | ORAL_CAPSULE | ORAL | 0 refills | Status: DC

## 2022-10-04 NOTE — Assessment & Plan Note (Signed)
Currently not an issue °We will monitor °

## 2022-10-04 NOTE — Patient Instructions (Signed)

## 2022-10-04 NOTE — Assessment & Plan Note (Signed)
Will give 1 month supply of phentermine 50 mg daily, she is advised this will not be refilled Reinforced high-protein, low-carb diet

## 2022-10-04 NOTE — Assessment & Plan Note (Signed)
A1c today Encourage low-carb diet and exercise for weight loss

## 2022-10-04 NOTE — Progress Notes (Signed)
Subjective:    Patient ID: Breanna Tran, female    DOB: 01-Dec-1970, 52 y.o.   MRN: [id number]  HPI  Patient presents to clinic today for follow-up of chronic conditions.  Anxiety: She is not currently taking any medication for this. She is not currently seeing a therapist.  She denies depression, SI/HI.  Insomnia: Currently not an issue. She is not taking any medications for this. There is no sleep study on file.  HLD: Her last LDL was 144, triglycerides 275, 03/2022.  She is not taking any cholesterol-lowering medication at this time.  She does not consume a low-fat diet.  Prediabetes: Her last A1c was 5.8%, 03/2022.  She is not taking any oral diabetic medication at this time.  She does not check her sugars.  Review of Systems  Past Medical History:  Diagnosis Date   Genital warts due to HPV (human papillomavirus) 2000   wart removed.    Gestational diabetes    2 nd pregnancy only   PONV (postoperative nausea and vomiting)    Strep throat    10/18    Current Outpatient Medications  Medication Sig Dispense Refill   ibuprofen (ADVIL) 200 MG tablet Take 200 mg by mouth every 6 (six) hours as needed.     polyethylene glycol (MIRALAX / GLYCOLAX) 17 g packet Take 17 g by mouth daily.     No current facility-administered medications for this visit.    Allergies  Allergen Reactions   Augmentin [Amoxicillin-Pot Clavulanate] Diarrhea    Family History  Problem Relation Age of Onset   Graves' disease Mother    Renal cancer Father    Healthy Sister    Lung cancer Maternal Grandfather    Brain cancer Maternal Uncle    Breast cancer Neg Hx     Social History   Socioeconomic History   Marital status: Married    Spouse name: Not on file   Number of children: Not on file   Years of education: Not on file   Highest education level: Not on file  Occupational History   Not on file  Tobacco Use   Smoking status: Former    Current packs/day: 0.00    Average packs/day:  0.5 packs/day for 15.0 years (7.5 ttl pk-yrs)    Types: E-cigarettes, Cigarettes    Start date: 01/09/1997    Quit date: 01/10/2012    Years since quitting: 10.7   Smokeless tobacco: Never  Vaping Use   Vaping status: Former  Substance and Sexual Activity   Alcohol use: Yes    Alcohol/week: 1.0 standard drink of alcohol    Types: 1 Glasses of wine per week    Comment: occ   Drug use: No   Sexual activity: Yes    Birth control/protection: Condom  Other Topics Concern   Not on file  Social History Narrative   Not on file   Social Determinants of Health   Financial Resource Strain: Medium Risk (06/12/2022)   Received from [facility]   Overall Financial Resource Strain (CARDIA)    Difficulty of Paying Living Expenses: Somewhat hard  Food Insecurity: Food Insecurity Present (06/12/2022)   Received from [facility]   Hunger Vital Sign    Worried About Running Out of Food in the Last Year: Often true    Ran Out of Food in the Last Year: Sometimes true  Transportation Needs: No Transportation Needs (06/12/2022)   Received from [facility] - Transportation  Lack of Transportation (Medical): No    Lack of Transportation (Non-Medical): No  Physical Activity: Unknown (06/12/2022)   Received from [facility]   Exercise Vital Sign    Days of Exercise per Week: 0 days    Minutes of Exercise per Session: Not on file  Stress: Stress Concern Present (06/12/2022)   Received from [facility] - Occupational Stress Questionnaire    Feeling of Stress : To some extent  Social Connections: Moderately Integrated (06/12/2022)   Received from [facility]   Social Network    How would you rate your social network (family, work, friends)?: Adequate participation with social networks  Intimate Partner Violence: Not At Risk (06/12/2022)   Received from [facility]   HITS    Over the last 12 months how often did your partner physically hurt  you?: 1    Over the last 12 months how often did your partner insult you or talk down to you?: 1    Over the last 12 months how often did your partner threaten you with physical harm?: 1    Over the last 12 months how often did your partner scream or curse at you?: 1     Constitutional: Denies fever, malaise, fatigue, headache or abrupt weight changes.  HEENT: Denies eye pain, eye redness, ear pain, ringing in the ears, wax buildup, runny nose, nasal congestion, bloody nose, or sore throat. Respiratory: Denies difficulty breathing, shortness of breath, cough or sputum production.   Cardiovascular: Denies chest pain, chest tightness, palpitations or swelling in the hands or feet.  Gastrointestinal: Denies abdominal pain, bloating, constipation, diarrhea or blood in the stool.  GU: Denies urgency, frequency, pain with urination, burning sensation, blood in urine, odor or discharge. Musculoskeletal: Denies decrease in range of motion, difficulty with gait, muscle pain or joint pain and swelling.  Skin: Denies redness, rashes, lesions or ulcercations.  Neurological: Patient reports insomnia.  Denies dizziness, difficulty with memory, difficulty with speech or problems with balance and coordination.  Psych: Patient has a history of anxiety.  Denies depression, SI/HI.  No other specific complaints in a complete review of systems (except as listed in HPI above).     Objective:   Physical Exam  BP 106/62 (BP Location: Left Arm, Patient Position: Sitting, Cuff Size: Normal)   Pulse (!) 56   Temp (!) 96 F (35.6 C) (Temporal)   Wt 189 lb (85.7 kg)   LMP 11/20/2019 (Approximate)   SpO2 99%   BMI 28.74 kg/m   Wt Readings from Last 3 Encounters:  04/03/22 176 lb (79.8 kg)  03/09/21 187 lb (84.8 kg)  09/30/20 174 lb 12.8 oz (79.3 kg)    General: Appears her stated age, overweight, in NAD. Skin: Warm, dry and intact.  HEENT: Head: normal shape and size; Eyes: sclera white, no icterus,  conjunctiva pink, PERRLA and EOMs intact;  Cardiovascular: Normal rate and rhythm. S1,S2 noted.  No murmur, rubs or gallops noted. No JVD or BLE edema. No carotid bruits noted. Pulmonary/Chest: Normal effort and positive vesicular breath sounds. No respiratory distress. No wheezes, rales or ronchi noted.  Abdomen: Soft and nontender. Normal bowel sounds.  Pelvic: Normal female anatomy.  Cervix not well-visualized.  No CMT.  Adnexa nonpalpable. Musculoskeletal: No difficulty with gait.  Neurological: Alert and oriented. Cranial nerves II-XII grossly intact. Coordination normal.  Psychiatric: Mood and affect normal. Behavior is normal. Judgment and thought content normal.    BMET  Component Value Date/Time   NA 142 04/03/2022 1332   NA 142 01/22/2020 1121   K 4.6 04/03/2022 1332   CL 105 04/03/2022 1332   CO2 28 04/03/2022 1332   GLUCOSE 94 04/03/2022 1332   BUN 13 04/03/2022 1332   BUN 9 01/22/2020 1121   CREATININE 0.81 04/03/2022 1332   CALCIUM 10.1 04/03/2022 1332   GFRNONAA 91 01/22/2020 1121   GFRAA 105 01/22/2020 1121    Lipid Panel     Component Value Date/Time   CHOL 242 (H) 04/03/2022 1332   TRIG 275 (H) 04/03/2022 1332   HDL 56 04/03/2022 1332   CHOLHDL 4.3 04/03/2022 1332   VLDL 11.0 06/18/2017 1102   LDLCALC 144 (H) 04/03/2022 1332    CBC    Component Value Date/Time   WBC 7.4 04/03/2022 1332   RBC 4.73 04/03/2022 1332   HGB 13.9 04/03/2022 1332   HGB 14.6 01/22/2020 1124   HCT 41.7 04/03/2022 1332   HCT 42.7 01/22/2020 1124   PLT 298 04/03/2022 1332   PLT 264 01/22/2020 1124   MCV 88.2 04/03/2022 1332   MCV 89 01/22/2020 1124   MCH 29.4 04/03/2022 1332   MCHC 33.3 04/03/2022 1332   RDW 12.6 04/03/2022 1332   RDW 12.3 01/22/2020 1124    Hgb A1C Lab Results  Component Value Date   HGBA1C 5.8 (H) 04/03/2022            Assessment & Plan:   Screening for cervical cancer with routine GYN exam:  Pap smear today, she declines STD  screening   RTC in 6 months for annual exam Nicki Reaper, NP

## 2022-10-04 NOTE — Assessment & Plan Note (Signed)
C-Met and lipid profile today Encouraged her to consume a low-fat diet 

## 2022-10-04 NOTE — Assessment & Plan Note (Signed)
Stable off meds We will monitor

## 2022-10-05 LAB — COMPLETE METABOLIC PANEL WITH GFR
AG Ratio: 1.7 (calc) (ref 1.0–2.5)
ALT: 30 U/L — ABNORMAL HIGH (ref 6–29)
AST: 23 U/L (ref 10–35)
Albumin: 4.2 g/dL (ref 3.6–5.1)
Alkaline phosphatase (APISO): 68 U/L (ref 37–153)
BUN: 19 mg/dL (ref 7–25)
CO2: 27 mmol/L (ref 20–32)
Calcium: 9.6 mg/dL (ref 8.6–10.4)
Chloride: 105 mmol/L (ref 98–110)
Creat: 0.83 mg/dL (ref 0.50–1.03)
Globulin: 2.5 g/dL (calc) (ref 1.9–3.7)
Glucose, Bld: 98 mg/dL (ref 65–99)
Potassium: 5 mmol/L (ref 3.5–5.3)
Sodium: 140 mmol/L (ref 135–146)
Total Bilirubin: 0.4 mg/dL (ref 0.2–1.2)
Total Protein: 6.7 g/dL (ref 6.1–8.1)
eGFR: 86 mL/min/{1.73_m2} (ref >=60)

## 2022-10-05 LAB — CBC
HCT: 41.7 % (ref 35.0–45.0)
Hemoglobin: 13.5 g/dL (ref 11.7–15.5)
MCH: 29.3 pg (ref 27.0–33.0)
MCHC: 32.4 g/dL (ref 32.0–36.0)
MCV: 90.7 fL (ref 80.0–100.0)
MPV: 10.9 fL (ref 7.5–12.5)
Platelets: 270 10*3/uL (ref 140–400)
RBC: 4.6 10*6/uL (ref 3.80–5.10)
RDW: 12.8 % (ref 11.0–15.0)
WBC: 5.7 10*3/uL (ref 3.8–10.8)

## 2022-10-05 LAB — LIPID PANEL
Cholesterol: 213 mg/dL — ABNORMAL HIGH (ref <200)
HDL: 60 mg/dL (ref >=50)
LDL Cholesterol (Calc): 131 mg/dL (calc) — ABNORMAL HIGH
Non-HDL Cholesterol (Calc): 153 mg/dL (calc) — ABNORMAL HIGH (ref <130)
Total CHOL/HDL Ratio: 3.6 (calc) (ref <5.0)
Triglycerides: 108 mg/dL (ref <150)

## 2022-10-05 LAB — HEMOGLOBIN A1C
Hgb A1c MFr Bld: 5.9 % of total Hgb — ABNORMAL HIGH (ref <5.7)
Mean Plasma Glucose: 123 mg/dL
eAG (mmol/L): 6.8 mmol/L

## 2022-10-12 LAB — CYTOLOGY - PAP
Adequacy: ABSENT
Comment: NEGATIVE
Diagnosis: UNDETERMINED — AB
High risk HPV: NEGATIVE

## 2022-10-13 ENCOUNTER — Other Ambulatory Visit: Payer: Self-pay | Admitting: Internal Medicine

## 2022-10-13 DIAGNOSIS — Z1212 Encounter for screening for malignant neoplasm of rectum: Secondary | ICD-10-CM

## 2022-10-13 DIAGNOSIS — Z1211 Encounter for screening for malignant neoplasm of colon: Secondary | ICD-10-CM

## 2022-11-10 ENCOUNTER — Ambulatory Visit: Payer: BC Managed Care – PPO

## 2022-11-10 DIAGNOSIS — Z1211 Encounter for screening for malignant neoplasm of colon: Secondary | ICD-10-CM | POA: Diagnosis not present

## 2022-11-10 DIAGNOSIS — K640 First degree hemorrhoids: Secondary | ICD-10-CM | POA: Diagnosis not present

## 2022-11-10 DIAGNOSIS — K5730 Diverticulosis of large intestine without perforation or abscess without bleeding: Secondary | ICD-10-CM | POA: Diagnosis not present

## 2022-11-10 DIAGNOSIS — Z83719 Family history of colon polyps, unspecified: Secondary | ICD-10-CM | POA: Diagnosis not present

## 2022-11-27 DIAGNOSIS — R519 Headache, unspecified: Secondary | ICD-10-CM | POA: Diagnosis not present

## 2022-11-27 DIAGNOSIS — R059 Cough, unspecified: Secondary | ICD-10-CM | POA: Diagnosis not present

## 2022-11-27 DIAGNOSIS — J069 Acute upper respiratory infection, unspecified: Secondary | ICD-10-CM | POA: Diagnosis not present

## 2022-12-21 DIAGNOSIS — K642 Third degree hemorrhoids: Secondary | ICD-10-CM | POA: Diagnosis not present

## 2023-01-11 DIAGNOSIS — K642 Third degree hemorrhoids: Secondary | ICD-10-CM | POA: Diagnosis not present

## 2023-01-25 DIAGNOSIS — K642 Third degree hemorrhoids: Secondary | ICD-10-CM | POA: Diagnosis not present

## 2023-01-31 ENCOUNTER — Telehealth: Payer: BC Managed Care – PPO | Admitting: Internal Medicine

## 2023-01-31 ENCOUNTER — Encounter: Payer: Self-pay | Admitting: Internal Medicine

## 2023-01-31 DIAGNOSIS — F419 Anxiety disorder, unspecified: Secondary | ICD-10-CM

## 2023-01-31 MED ORDER — ESCITALOPRAM OXALATE 10 MG PO TABS
10.0000 mg | ORAL_TABLET | Freq: Every day | ORAL | 0 refills | Status: DC
Start: 2023-01-31 — End: 2023-04-30

## 2023-01-31 NOTE — Patient Instructions (Signed)

## 2023-01-31 NOTE — Progress Notes (Signed)
Virtual Visit via Video Note  I connected with Breanna Tran on 01/31/23 at 11:20 AM EST by a video enabled telemedicine application and verified that I am speaking with the correct person using two identifiers.  Location: Patient: Home Provider: Office  Person's participating in this video call: Nicki Reaper, NP-C and Georgia Duff   I discussed the limitations of evaluation and management by telemedicine and the availability of in person appointments. The patient expressed understanding and agreed to proceed.  History of Present Illness:  Discussed the use of AI scribe software for clinical note transcription with the patient, who gave verbal consent to proceed.  The patient, previously on Lexapro 10mg  for anxiety management, has been experiencing escalating anxiety over the past few months. Despite employing their usual coping mechanisms, they report persistent, extreme anxiety throughout the day. They attribute the exacerbation of their symptoms to both work and life stress. They deny concurrent depression, suicidal ideation, or homicidal thoughts. They are not currently engaged in therapy. The patient has previously tried other treatments, but found Lexapro to be the most effective. They have discontinued the use of Phentermine. The patient wishes to resume Lexapro therapy.      Past Medical History:  Diagnosis Date   Genital warts due to HPV (human papillomavirus) 2000   wart removed.    Gestational diabetes    2 nd pregnancy only   PONV (postoperative nausea and vomiting)    Strep throat    10/18    Current Outpatient Medications  Medication Sig Dispense Refill   ibuprofen (ADVIL) 200 MG tablet Take 200 mg by mouth every 6 (six) hours as needed.     phentermine 15 MG capsule Take 1 capsule (15 mg total) by mouth every morning. 30 capsule 0   polyethylene glycol (MIRALAX / GLYCOLAX) 17 g packet Take 17 g by mouth daily.     No current facility-administered medications  for this visit.    Allergies  Allergen Reactions   Augmentin [Amoxicillin-Pot Clavulanate] Diarrhea    Family History  Problem Relation Age of Onset   Graves' disease Mother    Renal cancer Father    Healthy Sister    Lung cancer Maternal Grandfather    Brain cancer Maternal Uncle    Breast cancer Neg Hx     Social History   Socioeconomic History   Marital status: Married    Spouse name: Not on file   Number of children: Not on file   Years of education: Not on file   Highest education level: Not on file  Occupational History   Not on file  Tobacco Use   Smoking status: Former    Current packs/day: 0.00    Average packs/day: 0.5 packs/day for 15.0 years (7.5 ttl pk-yrs)    Types: E-cigarettes, Cigarettes    Start date: 01/09/1997    Quit date: 01/10/2012    Years since quitting: 11.0   Smokeless tobacco: Never  Vaping Use   Vaping status: Former  Substance and Sexual Activity   Alcohol use: Yes    Alcohol/week: 1.0 standard drink of alcohol    Types: 1 Glasses of wine per week    Comment: occ   Drug use: No   Sexual activity: Yes    Birth control/protection: Condom  Other Topics Concern   Not on file  Social History Narrative   Not on file   Social Drivers of Health   Financial Resource Strain: Medium Risk (06/12/2022)   Received from [facility]   Overall Financial Resource Strain (CARDIA)    Difficulty of Paying Living Expenses: Somewhat hard  Food Insecurity: Food Insecurity Present (06/12/2022)   Received from [facility]   Hunger Vital Sign    Worried About Running Out of Food in the Last Year: Often true    Ran Out of Food in the Last Year: Sometimes true  Transportation Needs: No Transportation Needs (06/12/2022)   Received from [facility] - Transportation    Lack of Transportation (Medical): No    Lack of Transportation (Non-Medical): No  Physical Activity: Unknown (06/12/2022)   Received from [facility]   Exercise Vital Sign     Days of Exercise per Week: 0 days    Minutes of Exercise per Session: Not on file  Stress: Stress Concern Present (06/12/2022)   Received from [facility] - Occupational Stress Questionnaire    Feeling of Stress : To some extent  Social Connections: Moderately Integrated (06/12/2022)   Received from [facility]   Social Network    How would you rate your social network (family, work, friends)?: Adequate participation with social networks  Intimate Partner Violence: Not At Risk (06/12/2022)   Received from [facility]   HITS    Over the last 12 months how often did your partner physically hurt you?: Never    Over the last 12 months how often did your partner insult you or talk down to you?: Never    Over the last 12 months how often did your partner threaten you with physical harm?: Never    Over the last 12 months how often did your partner scream or curse at you?: Never     Constitutional: Denies fever, malaise, fatigue, headache or abrupt weight changes.  Respiratory: Denies difficulty breathing, shortness of breath, cough or sputum production.   Cardiovascular: Denies chest pain, chest tightness, palpitations or swelling in the hands or feet.  Gastrointestinal: Denies abdominal pain, bloating, constipation, diarrhea or blood in the stool.  GU: Denies urgency, frequency, pain with urination, burning sensation, blood in urine, odor or discharge. Musculoskeletal: Denies decrease in range of motion, difficulty with gait, muscle pain or joint pain and swelling.  Skin: Denies redness, rashes, lesions or ulcercations.  Neurological: Patient reports insomnia.  Denies dizziness, difficulty with memory, difficulty with speech or problems with balance and coordination.  Psych: Pt has a history of anxiety. Denies depression, SI/HI.  No other specific complaints in a complete review of systems (except as listed in HPI  above).  Observations/Objective:  Wt Readings from Last 3 Encounters:  10/04/22 189 lb (85.7 kg)  04/03/22 176 lb (79.8 kg)  03/09/21 187 lb (84.8 kg)    General: Appears her stated age, in NAD. Pulmonary/Chest: Normal effort. No respiratory distress.  Neurological: Alert and oriented.  Psychiatric: Mood and affect normal. Behavior is normal. Judgment and thought content normal.     BMET    Component Value Date/Time   NA 140 10/04/2022 0855   NA 142 01/22/2020 1121   K 5.0 10/04/2022 0855   CL 105 10/04/2022 0855   CO2 27 10/04/2022 0855   GLUCOSE 98 10/04/2022 0855   BUN 19 10/04/2022 0855   BUN 9 01/22/2020 1121   CREATININE 0.83 10/04/2022 0855   CALCIUM 9.6 10/04/2022 0855   GFRNONAA 91 01/22/2020 1121   GFRAA 105 01/22/2020 1121    Lipid Panel     Component Value Date/Time  CHOL 213 (H) 10/04/2022 0855   TRIG 108 10/04/2022 0855   HDL 60 10/04/2022 0855   CHOLHDL 3.6 10/04/2022 0855   VLDL 11.0 06/18/2017 1102   LDLCALC 131 (H) 10/04/2022 0855    CBC    Component Value Date/Time   WBC 5.7 10/04/2022 0855   RBC 4.60 10/04/2022 0855   HGB 13.5 10/04/2022 0855   HGB 14.6 01/22/2020 1124   HCT 41.7 10/04/2022 0855   HCT 42.7 01/22/2020 1124   PLT 270 10/04/2022 0855   PLT 264 01/22/2020 1124   MCV 90.7 10/04/2022 0855   MCV 89 01/22/2020 1124   MCH 29.3 10/04/2022 0855   MCHC 32.4 10/04/2022 0855   RDW 12.8 10/04/2022 0855   RDW 12.3 01/22/2020 1124    Hgb A1C Lab Results  Component Value Date   HGBA1C 5.9 (H) 10/04/2022       Assessment and Plan:  Assessment and Plan    Anxiety Increased anxiety despite coping mechanisms. Previously managed well on Lexapro 10mg . No current therapy. No depressive or harmful thoughts. -Resume Lexapro 10mg  daily. -Evaluate effectiveness in 4 weeks.  RTC in 2 months for annual exam  Follow Up Instructions:    I discussed the assessment and treatment plan with the patient. The patient was provided  an opportunity to ask questions and all were answered. The patient agreed with the plan and demonstrated an understanding of the instructions.   The patient was advised to call back or seek an in-person evaluation if the symptoms worsen or if the condition fails to improve as anticipated.   Nicki Reaper, NP

## 2023-02-05 ENCOUNTER — Encounter: Payer: Self-pay | Admitting: Internal Medicine

## 2023-02-05 MED ORDER — HYDROCORTISONE ACETATE 25 MG RE SUPP: 25.0000 mg | Freq: Two times a day (BID) | RECTAL | 0 refills | Status: DC

## 2023-02-15 ENCOUNTER — Encounter (HOSPITAL_BASED_OUTPATIENT_CLINIC_OR_DEPARTMENT_OTHER): Payer: Self-pay

## 2023-02-15 ENCOUNTER — Ambulatory Visit (HOSPITAL_BASED_OUTPATIENT_CLINIC_OR_DEPARTMENT_OTHER)
Admission: EM | Admit: 2023-02-15 | Discharge: 2023-02-15 | Disposition: A | Discharge status: Home / Self Care | Payer: BC Managed Care – PPO | Attending: Internal Medicine | Admitting: Internal Medicine

## 2023-02-15 DIAGNOSIS — Z20828 Contact with and (suspected) exposure to other viral communicable diseases: Secondary | ICD-10-CM | POA: Diagnosis not present

## 2023-02-15 LAB — POC COVID19/FLU A&B COMBO
Covid Antigen, POC: NEGATIVE
Influenza A Antigen, POC: NEGATIVE
Influenza B Antigen, POC: NEGATIVE

## 2023-02-15 MED ORDER — OSELTAMIVIR PHOSPHATE 75 MG PO CAPS: 75.0000 mg | ORAL_CAPSULE | Freq: Two times a day (BID) | ORAL | 0 refills | Status: DC

## 2023-02-15 NOTE — ED Triage Notes (Signed)
 Cough, fever, chills, body aches onset yesterday afternoon. Co-worker here tonight and tested positive for flu A.

## 2023-02-15 NOTE — ED Provider Notes (Signed)
 Breanna Tran [facility]    CSN: [id number] Arrival date & time: 02/15/23  1657      History   Chief Complaint Chief Complaint  Patient presents with   Cough   Fever   Generalized Body Aches    HPI Breanna Tran is a 53 y.o. female who presents with onset of fever, chills, body aches since yesterday pm. Her co-worker tested for flu A today and has been around her. Denies GI symptoms    Past Medical History:  Diagnosis Date   Genital warts due to HPV (human papillomavirus) 2000   wart removed.    Gestational diabetes    2 nd pregnancy only   PONV (postoperative nausea and vomiting)    Strep throat    10/18    Patient Active Problem List   Diagnosis Date Noted   Prediabetes 10/04/2022   Pure hypercholesterolemia 04/03/2022   Overweight with body mass index (BMI) of 28 to 28.9 in adult 07/28/2020   Psychophysiological insomnia 05/21/2017   Anxiety 05/21/2017    Past Surgical History:  Procedure Laterality Date   APPENDECTOMY  1993   COSMETIC SURGERY Bilateral    ears   PILONIDAL CYST EXCISION     SPHINCTEROTOMY N/A 12/15/2016   Procedure: CHEMICAL SPHINCTEROTOMY (BOTOX );  Surgeon: Debby Hila, MD;  Location: [facility];  Service: General;  Laterality: N/A;   WISDOM TOOTH EXTRACTION  1995    OB History     Gravida  4   Para  2   Term  1   Preterm      AB  2   Living         SAB  2   IAB      Ectopic      Multiple      Live Births               Home Medications    Prior to Admission medications   Medication Sig Start Date End Date Taking? Authorizing Provider  oseltamivir  (TAMIFLU ) 75 MG capsule Take 1 capsule (75 mg total) by mouth every 12 (twelve) hours. 02/15/23  Yes Rodriguez-Southworth, Tayvia Faughnan, PA-C  escitalopram  (LEXAPRO ) 10 MG tablet Take 1 tablet (10 mg total) by mouth daily. 01/31/23   Antonette Angeline ORN, NP  hydrocortisone  (ANUSOL -HC) 25 MG suppository Place 1 suppository (25 mg total) rectally 2 (two) times  daily. For 7 days 02/05/23   Antonette Angeline ORN, NP  ibuprofen  (ADVIL ) 200 MG tablet Take 200 mg by mouth every 6 (six) hours as needed.    [provider]  polyethylene glycol (MIRALAX  / GLYCOLAX ) 17 g packet Take 17 g by mouth daily.    [provider]    Family History Family History  Problem Relation Age of Onset   Graves' disease Mother    Renal cancer Father    Healthy Sister    Lung cancer Maternal Grandfather    Brain cancer Maternal Uncle    Breast cancer Neg Hx     Social History Social History   Tobacco Use   Smoking status: Former    Current packs/day: 0.00    Average packs/day: 0.5 packs/day for 15.0 years (7.5 ttl pk-yrs)    Types: E-cigarettes, Cigarettes    Start date: 01/09/1997    Quit date: 01/10/2012    Years since quitting: 11.1   Smokeless tobacco: Never  Vaping Use   Vaping status: Former  Substance Use Topics   Alcohol use: Yes  Alcohol/week: 1.0 standard drink of alcohol    Types: 1 Glasses of wine per week    Comment: occ   Drug use: No     Allergies   Augmentin [amoxicillin -pot clavulanate]   Review of Systems Review of Systems As noted in HPI  Physical Exam Triage Vital Signs ED Triage Vitals [02/15/23 2000]  Encounter Vitals Group     BP 114/74     Systolic BP Percentile      Diastolic BP Percentile      Pulse Rate 78     Resp 20     Temp 99.8 F (37.7 C)     Temp Source Oral     SpO2 98 %     Weight 185 lb (83.9 kg)     Height      Head Circumference      Peak Flow      Pain Score 2     Pain Loc      Pain Education      Exclude from Growth Chart    No data found.  Updated Vital Signs BP 114/74 (BP Location: Right Arm)   Pulse 78   Temp 99.8 F (37.7 C) (Oral)   Resp 20   Wt 185 lb (83.9 kg)   LMP 11/20/2019 (Approximate)   SpO2 98%   BMI 28.13 kg/m   Visual Acuity Right Eye Distance:   Left Eye Distance:   Bilateral Distance:    Right Eye Near:   Left Eye Near:    Bilateral Near:      Physical Exam Physical Exam Vitals signs and nursing note reviewed.  Constitutional:      General: She is not in acute distress.    Appearance: Normal appearance. She is not ill-appearing, toxic-appearing or diaphoretic.  HENT:     Head: Normocephalic.     Right Ear: Tympanic membrane, ear canal and external ear normal.     Left Ear: Tympanic membrane, ear canal and external ear normal.     Nose: Nose normal.     Mouth/Throat:     Mouth: Mucous membranes are moist.  Eyes:     General: No scleral icterus.       Right eye: No discharge.        Left eye: No discharge.     Conjunctiva/sclera: Conjunctivae normal.  Neck:     Musculoskeletal: Neck supple. No neck rigidity.  Cardiovascular:     Rate and Rhythm: Normal rate and regular rhythm.     Heart sounds: No murmur.  Pulmonary:     Effort: Pulmonary effort is normal.     Breath sounds: Normal breath sounds.  Musculoskeletal: Normal range of motion.  Lymphadenopathy:     Cervical: No cervical adenopathy.  Skin:    General: Skin is warm and dry.     Coloration: Skin is not jaundiced.     Findings: No rash.  Neurological:     Mental Status: She is alert and oriented to person, place, and time.     Gait: Gait normal.  Psychiatric:        Mood and Affect: Mood normal.        Behavior: Behavior normal.        Thought Content: Thought content normal.        Judgment: Judgment normal.    UC Treatments / Results  Labs (all labs ordered are listed, but only abnormal results are displayed) Labs Reviewed  POC COVID19/FLU A&B COMBO - Normal  Flu,  and covid test are negative  EKG   Radiology No results found.  Procedures Procedures (including critical care time)  Medications Ordered in UC Medications - No data to display  Initial Impression / Assessment and Plan / UC Course  I have reviewed the triage vital signs and the nursing notes.  Pertinent labs  results that were available during my care of the patient were  reviewed by me and considered in my medical decision making (see chart for details).  Flu exposure I believe the test is too early to show and I suspect she has influenza  I placed her on Tamiflu  as noted. No work the rest of the week.    Final Clinical Impressions(s) / UC Diagnoses   Final diagnoses:  Exposure to influenza   Discharge Instructions   None    ED Prescriptions     Medication Sig Dispense Auth. Provider   oseltamivir  (TAMIFLU ) 75 MG capsule Take 1 capsule (75 mg total) by mouth every 12 (twelve) hours. 10 capsule Rodriguez-Southworth, Crit Obremski, PA-C      PDMP not reviewed this encounter.   Lindi Carter, DEVONNA 02/15/23 2043

## 2023-03-09 IMAGING — MG MM DIGITAL SCREENING BILAT W/ TOMO AND CAD
8 series · 8 of 24 positions shown · non-contrast
Comparison: None.

CLINICAL DATA: Screening.

EXAM:
DIGITAL SCREENING BILATERAL MAMMOGRAM WITH TOMOSYNTHESIS AND CAD
TECHNIQUE: Bilateral screening digital craniocaudal and mediolateral oblique
mammograms were obtained. Bilateral screening digital breast
tomosynthesis was performed. The images were evaluated with
computer-aided detection.

[L MLO synth-2D]
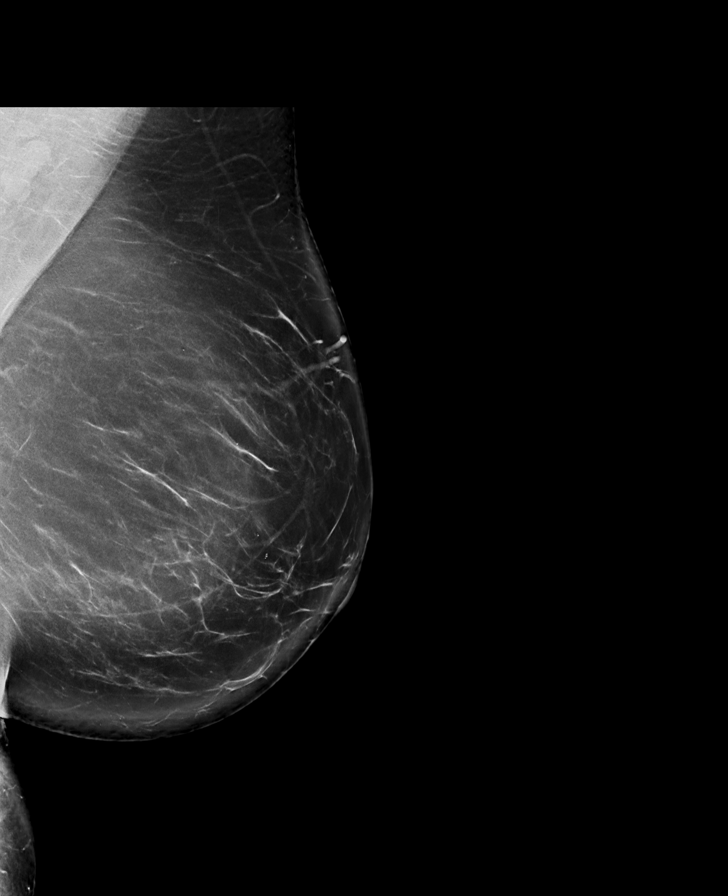

[R CC synth-2D]
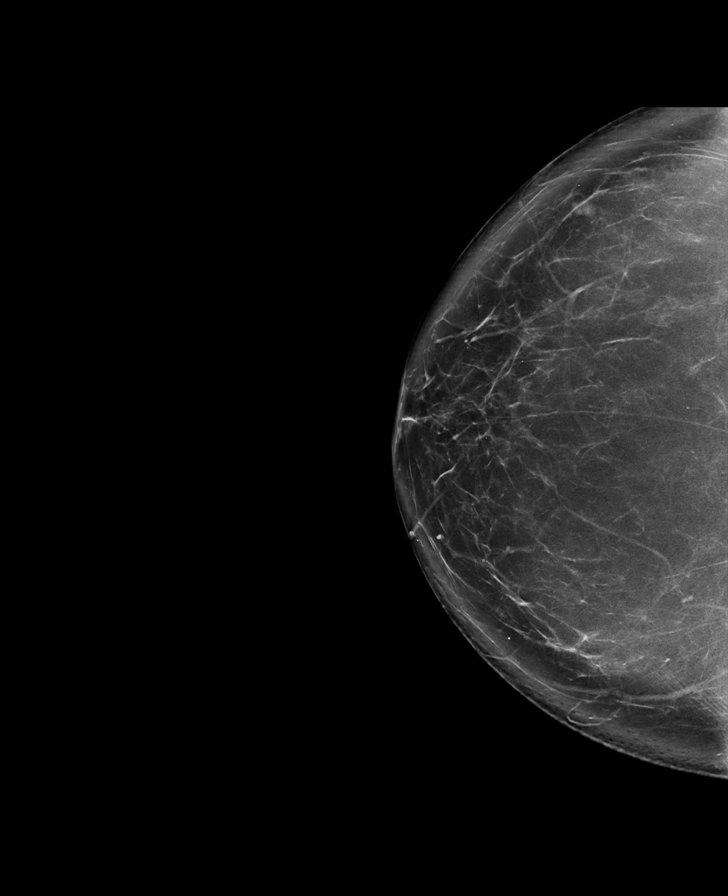

[L CC synth-2D]
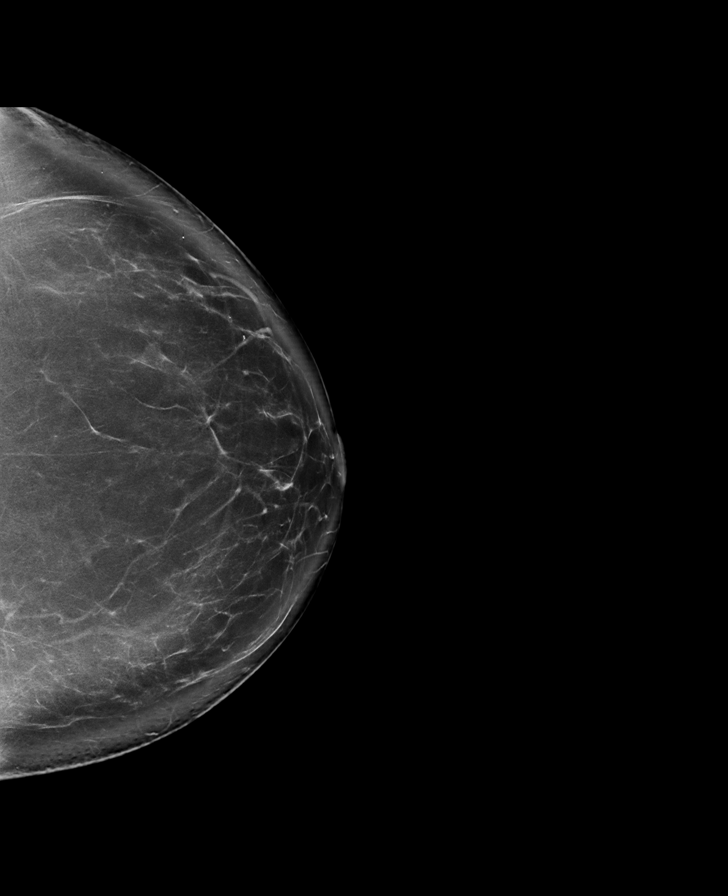

[R MLO synth-2D]
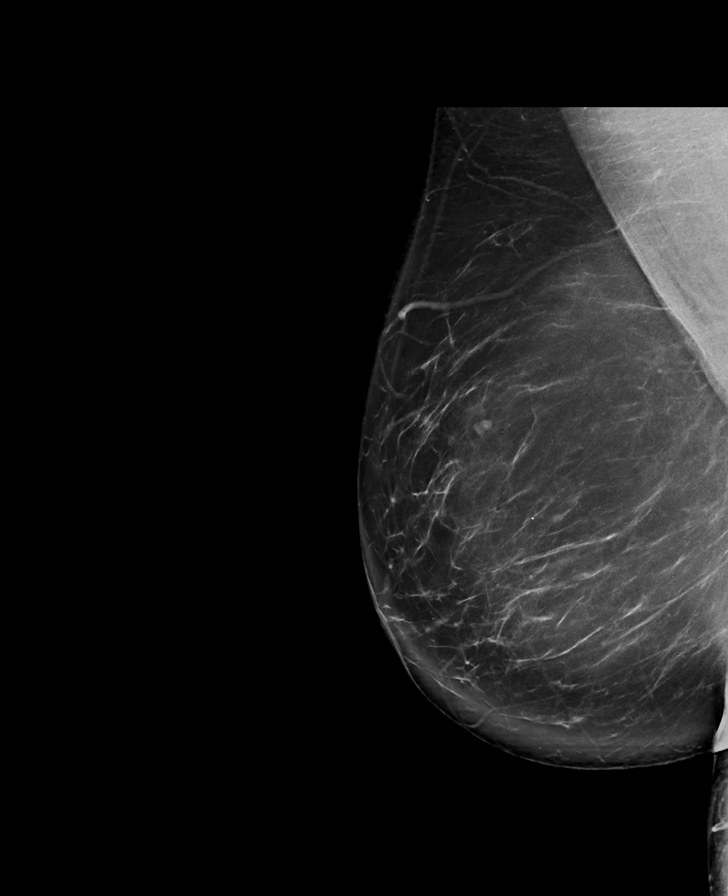

[R CC tomo · tomo slice 47/94.0]
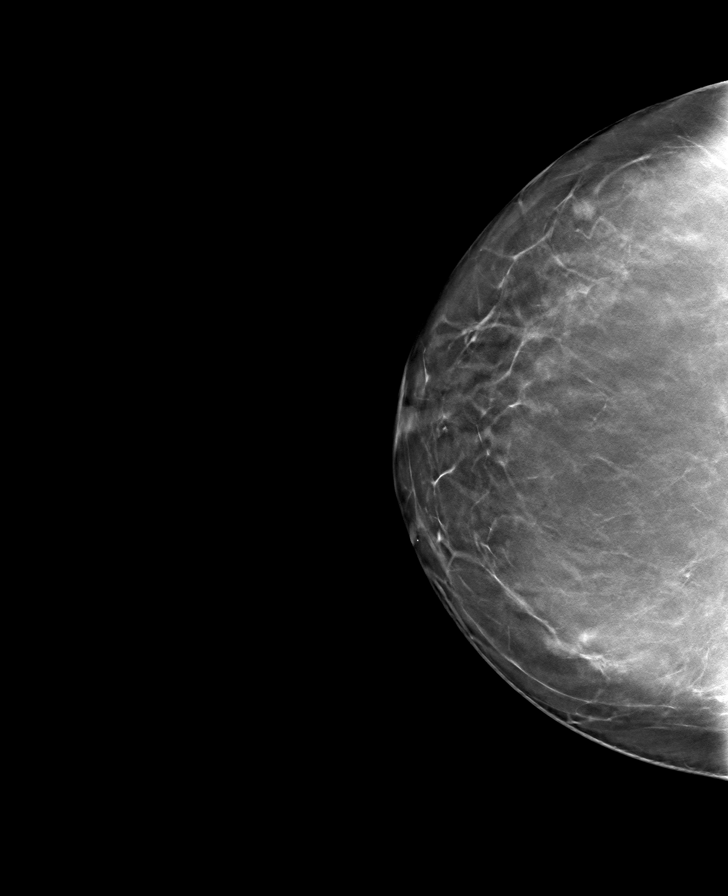

[L CC tomo · tomo slice 51/101.0]
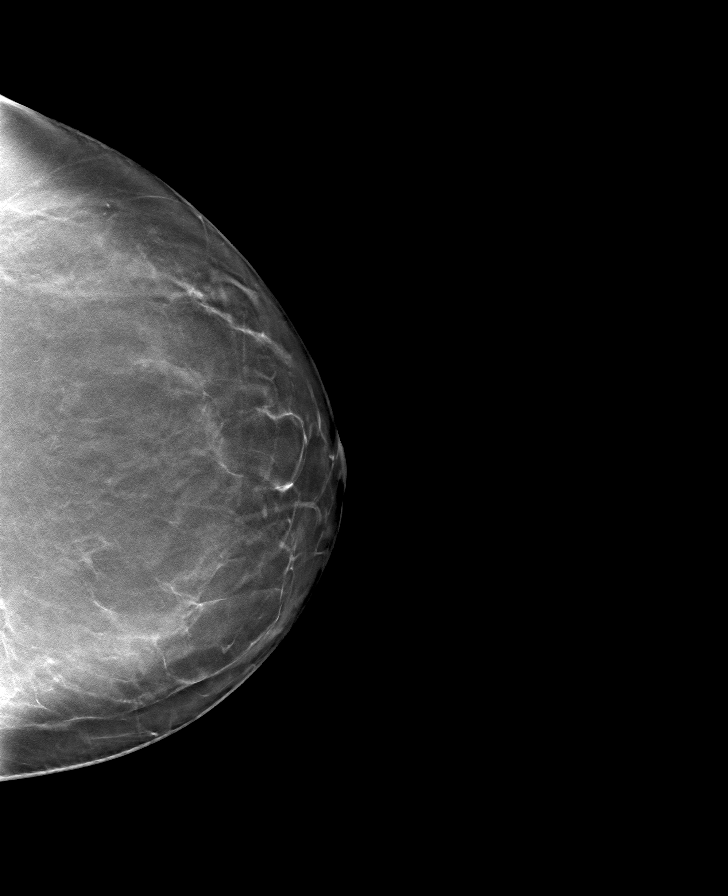

[R MLO tomo · tomo slice 53/106.0]
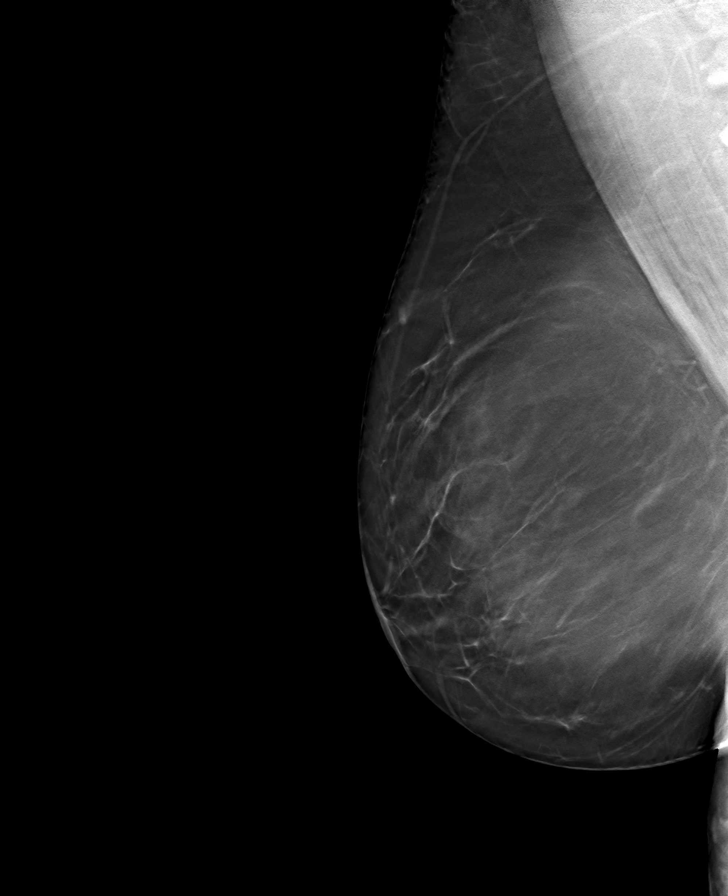

[L MLO tomo · tomo slice 55/109.0]
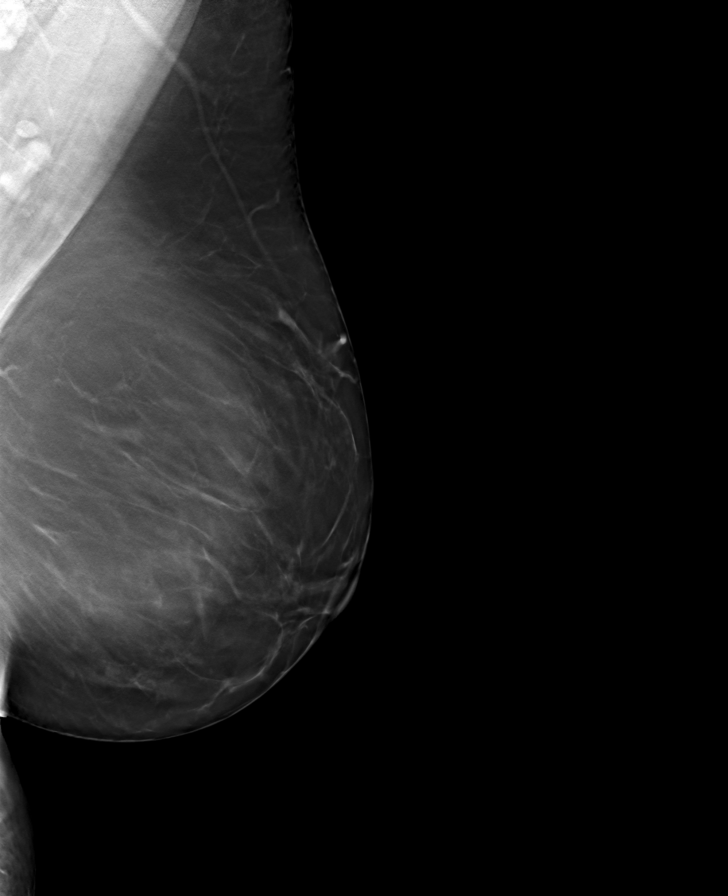

[8 of 24 positions shown; findings below may reference images not displayed]

ACR Breast Density Category b: There are scattered areas of
fibroglandular density.
FINDINGS: There are no findings suspicious for malignancy.
IMPRESSION: No mammographic evidence of malignancy. A result letter of this
screening mammogram will be mailed directly to the patient.

RECOMMENDATION:
Screening mammogram in one year. (Code:XG-X-X7B)

BI-RADS CATEGORY  1: Negative.

## 2023-03-12 ENCOUNTER — Ambulatory Visit: Payer: BC Managed Care – PPO | Admitting: Internal Medicine

## 2023-03-22 ENCOUNTER — Other Ambulatory Visit: Payer: Self-pay | Admitting: Internal Medicine

## 2023-03-22 DIAGNOSIS — Z1231 Encounter for screening mammogram for malignant neoplasm of breast: Secondary | ICD-10-CM

## 2023-03-29 ENCOUNTER — Ambulatory Visit: Payer: Self-pay | Admitting: Internal Medicine

## 2023-04-04 ENCOUNTER — Encounter: Payer: BC Managed Care – PPO | Admitting: Internal Medicine

## 2023-04-12 ENCOUNTER — Ambulatory Visit: Payer: BC Managed Care – PPO | Admitting: Internal Medicine

## 2023-04-20 ENCOUNTER — Ambulatory Visit: Admitting: Internal Medicine

## 2023-04-28 ENCOUNTER — Other Ambulatory Visit: Payer: Self-pay | Admitting: Internal Medicine

## 2023-04-30 ENCOUNTER — Telehealth: Payer: Self-pay

## 2023-04-30 ENCOUNTER — Ambulatory Visit (INDEPENDENT_AMBULATORY_CARE_PROVIDER_SITE_OTHER): Admitting: Internal Medicine

## 2023-04-30 ENCOUNTER — Encounter: Payer: Self-pay | Admitting: Internal Medicine

## 2023-04-30 VITALS — BP 112/64 | Ht 68.0 in | Wt 195.6 lb

## 2023-04-30 DIAGNOSIS — R0683 Snoring: Secondary | ICD-10-CM

## 2023-04-30 DIAGNOSIS — E663 Overweight: Secondary | ICD-10-CM

## 2023-04-30 DIAGNOSIS — E7800 Pure hypercholesterolemia, unspecified: Secondary | ICD-10-CM

## 2023-04-30 DIAGNOSIS — R7303 Prediabetes: Secondary | ICD-10-CM

## 2023-04-30 DIAGNOSIS — Z6829 Body mass index (BMI) 29.0-29.9, adult: Secondary | ICD-10-CM

## 2023-04-30 DIAGNOSIS — Z0001 Encounter for general adult medical examination with abnormal findings: Secondary | ICD-10-CM | POA: Diagnosis not present

## 2023-04-30 MED ORDER — ESCITALOPRAM OXALATE 10 MG PO TABS: 10.0000 mg | ORAL_TABLET | Freq: Every day | ORAL | 1 refills | Status: DC

## 2023-04-30 NOTE — Progress Notes (Addendum)
 Subjective:    Patient ID: Breanna Tran, female    DOB: [birth year], 53 y.o.   MRN: [id number]  HPI  Patient presents to clinic today for annual exam.  Flu: Never Tetanus: 01/2007 COVID: Never Shingrix: 10/2022, 03/2023 Pap smear: 09/2022 Mammogram: 04/2022 Colon screening: 11/2022 Vision screening: annually Dentist: biannually  Diet: She rarely eats meat. She consumes fruits and veggies. She tries to avoid fried foods. She drinks mostly water and coffee. Exercise: Walking  Review of Systems     Past Medical History:  Diagnosis Date   Genital warts due to HPV (human papillomavirus) 2000   wart removed.    Gestational diabetes    2 nd pregnancy only   PONV (postoperative nausea and vomiting)    Strep throat    10/18    Current Outpatient Medications  Medication Sig Dispense Refill   escitalopram  (LEXAPRO ) 10 MG tablet Take 1 tablet (10 mg total) by mouth daily. 90 tablet 0   hydrocortisone  (ANUSOL -HC) 25 MG suppository Place 1 suppository (25 mg total) rectally 2 (two) times daily. For 7 days 14 suppository 0   ibuprofen  (ADVIL ) 200 MG tablet Take 200 mg by mouth every 6 (six) hours as needed.     oseltamivir  (TAMIFLU ) 75 MG capsule Take 1 capsule (75 mg total) by mouth every 12 (twelve) hours. 10 capsule 0   polyethylene glycol (MIRALAX  / GLYCOLAX ) 17 g packet Take 17 g by mouth daily.     No current facility-administered medications for this visit.    Allergies  Allergen Reactions   Augmentin [Amoxicillin -Pot Clavulanate] Diarrhea    Family History  Problem Relation Age of Onset   Graves' disease Mother    Renal cancer Father    Healthy Sister    Lung cancer Maternal Grandfather    Brain cancer Maternal Uncle    Breast cancer Neg Hx     Social History   Socioeconomic History   Marital status: Married    Spouse name: Not on file   Number of children: Not on file   Years of education: Not on file   Highest education level: Not on file  Occupational  History   Not on file  Tobacco Use   Smoking status: Former    Current packs/day: 0.00    Average packs/day: 0.5 packs/day for 15.0 years (7.5 ttl pk-yrs)    Types: E-cigarettes, Cigarettes    Start date: 01/09/1997    Quit date: 01/10/2012    Years since quitting: 11.3   Smokeless tobacco: Never  Vaping Use   Vaping status: Former  Substance and Sexual Activity   Alcohol use: Yes    Alcohol/week: 1.0 standard drink of alcohol    Types: 1 Glasses of wine per week    Comment: occ   Drug use: No   Sexual activity: Yes    Birth control/protection: Condom  Other Topics Concern   Not on file  Social History Narrative   Not on file   Social Drivers of Health   Financial Resource Strain: Medium Risk (06/12/2022)   Received from [facility]   Overall Financial Resource Strain (CARDIA)    Difficulty of Paying Living Expenses: Somewhat hard  Food Insecurity: Food Insecurity Present (06/12/2022)   Received from [facility]   Hunger Vital Sign    Worried About Running Out of Food in the Last Year: Often true    Ran Out of Food in the Last Year: Sometimes true  Transportation Needs: No Transportation Needs (06/12/2022)  Received from [facility]   PRAPARE - Transportation    Lack of Transportation (Medical): No    Lack of Transportation (Non-Medical): No  Physical Activity: Unknown (06/12/2022)   Received from [facility]   Exercise Vital Sign    Days of Exercise per Week: 0 days    Minutes of Exercise per Session: Not on file  Stress: Stress Concern Present (06/12/2022)   Received from [facility] of Occupational Health - Occupational Stress Questionnaire    Feeling of Stress : To some extent  Social Connections: Moderately Integrated (06/12/2022)   Received from [facility]   Social Network    How would you rate your social network (family, work, friends)?: Adequate participation with social networks  Intimate Partner Violence: Not At Risk (06/12/2022)    Received from [facility]   HITS    Over the last 12 months how often did your partner physically hurt you?: Never    Over the last 12 months how often did your partner insult you or talk down to you?: Never    Over the last 12 months how often did your partner threaten you with physical harm?: Never    Over the last 12 months how often did your partner scream or curse at you?: Never     Constitutional: Denies fever, malaise, fatigue, headache or abrupt weight changes.  HEENT: Denies eye pain, eye redness, ear pain, ringing in the ears, wax buildup, runny nose, nasal congestion, bloody nose, or sore throat. Respiratory: Denies difficulty breathing, shortness of breath, cough or sputum production.   Cardiovascular: Denies chest pain, chest tightness, palpitations or swelling in the hands or feet.  Gastrointestinal: Pt reports intermittent reflux. Denies abdominal pain, bloating, constipation, diarrhea or blood in the stool.  GU: Pt reports urinary frequency. Denies urgency, pain with urination, burning sensation, blood in urine, odor or discharge. Musculoskeletal: Denies decrease in range of motion, difficulty with gait, muscle pain or joint pain and swelling.  Skin: Denies redness, rashes, lesions or ulcercations.  Neurological: Patient reports insomnia, snoring.  Denies dizziness, difficulty with memory, difficulty with speech or problems with balance and coordination.  Psych: Patient has a history of anxiety.  Denies depression, SI/HI.  No other specific complaints in a complete review of systems (except as listed in HPI above).  Objective:   Physical Exam   BP 112/64 (BP Location: Left Arm, Patient Position: Sitting, Cuff Size: Normal)   Ht 5\' 8"  (1.727 m)   Wt 195 lb 9.6 oz (88.7 kg)   LMP 11/20/2019 (Approximate)   BMI 29.74 kg/m    Wt Readings from Last 3 Encounters:  02/15/23 185 lb (83.9 kg)  10/04/22 189 lb (85.7 kg)  04/03/22 176 lb (79.8 kg)    General: Appears  her stated age, overweight, in NAD. Skin: Warm, dry and intact.  HEENT: Head: normal shape and size; Eyes: sclera white, no icterus, conjunctiva pink, PERRLA and EOMs intact;  Neck:  Neck supple, trachea midline. No masses, lumps or thyromegaly present.  Cardiovascular: Normal rate and rhythm. S1,S2 noted.  No murmur, rubs or gallops noted. No JVD or BLE edema.  Pulmonary/Chest: Normal effort and positive vesicular breath sounds. No respiratory distress. No wheezes, rales or ronchi noted.  Abdomen: Normal bowel sounds.  Musculoskeletal: Strength 5/5 BUE/BLE.  No difficulty with gait.  Neurological: Alert and oriented. Cranial nerves II-XII grossly intact. Coordination normal.  Psychiatric: Mood and affect normal. Behavior is normal. Judgment and thought content normal.  BMET    Component Value Date/Time   NA 140 10/04/2022 0855   NA 142 01/22/2020 1121   K 5.0 10/04/2022 0855   CL 105 10/04/2022 0855   CO2 27 10/04/2022 0855   GLUCOSE 98 10/04/2022 0855   BUN 19 10/04/2022 0855   BUN 9 01/22/2020 1121   CREATININE 0.83 10/04/2022 0855   CALCIUM 9.6 10/04/2022 0855   GFRNONAA 91 01/22/2020 1121   GFRAA 105 01/22/2020 1121    Lipid Panel     Component Value Date/Time   CHOL 213 (H) 10/04/2022 0855   TRIG 108 10/04/2022 0855   HDL 60 10/04/2022 0855   CHOLHDL 3.6 10/04/2022 0855   VLDL 11.0 06/18/2017 1102   LDLCALC 131 (H) 10/04/2022 0855    CBC    Component Value Date/Time   WBC 5.7 10/04/2022 0855   RBC 4.60 10/04/2022 0855   HGB 13.5 10/04/2022 0855   HGB 14.6 01/22/2020 1124   HCT 41.7 10/04/2022 0855   HCT 42.7 01/22/2020 1124   PLT 270 10/04/2022 0855   PLT 264 01/22/2020 1124   MCV 90.7 10/04/2022 0855   MCV 89 01/22/2020 1124   MCH 29.3 10/04/2022 0855   MCHC 32.4 10/04/2022 0855   RDW 12.8 10/04/2022 0855   RDW 12.3 01/22/2020 1124    Hgb A1C Lab Results  Component Value Date   HGBA1C 5.9 (H) 10/04/2022           Assessment & Plan:   Preventative Health Maintenance:  Encouraged her to get a flu shot in the fall She declines tetanus today Encouraged her to get a COVID-vaccine Shingrix UTD Pap smear UTD Mammogram has already been scheduled Colon screening UTD Encouraged her to consume a balanced diet and exercise regimen Advised her seeing eye doctor and dentist annually Will check CBC, c-Met, lipid, A1c today  Snoring:  Referral for home sleep study placed  RTC in 6 months, follow-up chronic conditions Helayne Lo, NP

## 2023-04-30 NOTE — Telephone Encounter (Signed)
 Copied from CRM [id number]. Topic: Clinical - Prescription Issue >> Apr 30, 2023  9:31 AM Breanna Tran wrote: Reason for CRM: Breanna Tran called in to notify about medication escitalopram  (LEXAPRO ) 10 MG tablet being denied. Breanna Tran stated she was reached out by pharmacy stating that her med refill was denied. Breanna Tran would like someone to follow up [phone]

## 2023-04-30 NOTE — Assessment & Plan Note (Signed)
 Encouraged diet and exercise for weight loss ?

## 2023-04-30 NOTE — Patient Instructions (Signed)

## 2023-05-01 ENCOUNTER — Encounter: Payer: Self-pay | Admitting: Internal Medicine

## 2023-05-01 LAB — COMPREHENSIVE METABOLIC PANEL WITH GFR
AG Ratio: 1.6 (calc) (ref 1.0–2.5)
ALT: 17 U/L (ref 6–29)
AST: 14 U/L (ref 10–35)
Albumin: 4.1 g/dL (ref 3.6–5.1)
Alkaline phosphatase (APISO): 55 U/L (ref 37–153)
BUN: 12 mg/dL (ref 7–25)
CO2: 29 mmol/L (ref 20–32)
Calcium: 9.4 mg/dL (ref 8.6–10.4)
Chloride: 105 mmol/L (ref 98–110)
Creat: 0.82 mg/dL (ref 0.50–1.03)
Globulin: 2.5 g/dL (ref 1.9–3.7)
Glucose, Bld: 99 mg/dL (ref 65–139)
Potassium: 4.8 mmol/L (ref 3.5–5.3)
Sodium: 139 mmol/L (ref 135–146)
Total Bilirubin: 0.3 mg/dL (ref 0.2–1.2)
Total Protein: 6.6 g/dL (ref 6.1–8.1)
eGFR: 87 mL/min/{1.73_m2} (ref >=60)

## 2023-05-01 LAB — CBC
HCT: 41.9 % (ref 35.0–45.0)
Hemoglobin: 13.5 g/dL (ref 11.7–15.5)
MCH: 29.1 pg (ref 27.0–33.0)
MCHC: 32.2 g/dL (ref 32.0–36.0)
MCV: 90.3 fL (ref 80.0–100.0)
MPV: 10.6 fL (ref 7.5–12.5)
Platelets: 293 10*3/uL (ref 140–400)
RBC: 4.64 10*6/uL (ref 3.80–5.10)
RDW: 13.1 % (ref 11.0–15.0)
WBC: 6.3 10*3/uL (ref 3.8–10.8)

## 2023-05-01 LAB — HEMOGLOBIN A1C
Hgb A1c MFr Bld: 5.7 % — ABNORMAL HIGH (ref <5.7)
Mean Plasma Glucose: 117 mg/dL
eAG (mmol/L): 6.5 mmol/L

## 2023-05-01 LAB — LIPID PANEL
Cholesterol: 199 mg/dL (ref <200)
HDL: 53 mg/dL (ref >=50)
LDL Cholesterol (Calc): 119 mg/dL — ABNORMAL HIGH
Non-HDL Cholesterol (Calc): 146 mg/dL — ABNORMAL HIGH (ref <130)
Total CHOL/HDL Ratio: 3.8 (calc) (ref <5.0)
Triglycerides: 153 mg/dL — ABNORMAL HIGH (ref <150)

## 2023-05-07 ENCOUNTER — Ambulatory Visit: Admitting: Internal Medicine

## 2023-05-07 VITALS — BP 110/62 | Ht 68.0 in | Wt 190.8 lb

## 2023-05-07 DIAGNOSIS — J301 Allergic rhinitis due to pollen: Secondary | ICD-10-CM

## 2023-05-07 DIAGNOSIS — J069 Acute upper respiratory infection, unspecified: Secondary | ICD-10-CM | POA: Diagnosis not present

## 2023-05-07 NOTE — Progress Notes (Signed)
 Subjective:    Patient ID: Breanna Tran, female    DOB: 1970/03/16, 53 y.o.   MRN: [id number]  HPI  Discussed the use of AI scribe software for clinical note transcription with the patient, who gave verbal consent to proceed.   Breanna Tran is a 53 year old female who presents with chest congestion, headache, and nasal congestion.  She has been experiencing chest congestion, headache, nasal congestion, and tiredness for the past three days. She also notes sinus pressure, runny nose, ear pain, and sore throat. Nasal discharge is clear when she blows her nose.  She describes having a congested cough with clear sputum. No shortness of breath, nausea, vomiting, or diarrhea. She reports body aches and chills but denies having a fever.  She has been taking Mucinex and ibuprofen  to manage her symptoms. She also takes Zyrtec daily for allergies.  She mentions that the whole household has been sick, and she believes it is her turn to be unwell. No one in the house has been tested for COVID-19 or flu, and there have been no positive tests for these conditions.       Review of Systems   Past Medical History:  Diagnosis Date   Genital warts due to HPV (human papillomavirus) 2000   wart removed.    Gestational diabetes    2 nd pregnancy only   PONV (postoperative nausea and vomiting)    Strep throat    10/18    Current Outpatient Medications  Medication Sig Dispense Refill   cetirizine (ZYRTEC) 10 MG tablet Take 10 mg by mouth daily.     escitalopram  (LEXAPRO ) 10 MG tablet Take 1 tablet (10 mg total) by mouth daily. 90 tablet 1   hydrocortisone  (ANUSOL -HC) 25 MG suppository Place 1 suppository (25 mg total) rectally 2 (two) times daily. For 7 days 14 suppository 0   ibuprofen  (ADVIL ) 200 MG tablet Take 200 mg by mouth every 6 (six) hours as needed.     polyethylene glycol (MIRALAX  / GLYCOLAX ) 17 g packet Take 17 g by mouth daily.     RUTIN PO Take 1,000 mg by mouth.     No  current facility-administered medications for this visit.    Allergies  Allergen Reactions   Augmentin [Amoxicillin -Pot Clavulanate] Diarrhea    Family History  Problem Relation Age of Onset   Graves' disease Mother    Renal cancer Father    Healthy Sister    Lung cancer Maternal Grandfather    Brain cancer Maternal Uncle    Breast cancer Neg Hx     Social History   Socioeconomic History   Marital status: Married    Spouse name: Not on file   Number of children: Not on file   Years of education: Not on file   Highest education level: Associate degree: occupational, Scientist, product/process development, or vocational program  Occupational History   Not on file  Tobacco Use   Smoking status: Former    Current packs/day: 0.00    Average packs/day: 0.5 packs/day for 15.0 years (7.5 ttl pk-yrs)    Types: E-cigarettes, Cigarettes    Start date: 01/09/1997    Quit date: 01/10/2012    Years since quitting: 11.3   Smokeless tobacco: Never  Vaping Use   Vaping status: Former  Substance and Sexual Activity   Alcohol use: Yes    Alcohol/week: 1.0 standard drink of alcohol    Types: 1 Glasses of wine per week    Comment: occ  Drug use: No   Sexual activity: Yes    Birth control/protection: Condom  Other Topics Concern   Not on file  Social History Narrative   Not on file   Social Drivers of Health   Financial Resource Strain: Medium Risk (05/07/2023)   Overall Financial Resource Strain (CARDIA)    Difficulty of Paying Living Expenses: Somewhat hard  Food Insecurity: Food Insecurity Present (05/07/2023)   Hunger Vital Sign    Worried About Running Out of Food in the Last Year: Sometimes true    Ran Out of Food in the Last Year: Sometimes true  Transportation Needs: No Transportation Needs (05/07/2023)   PRAPARE - Administrator, Civil Service (Medical): No    Lack of Transportation (Non-Medical): No  Physical Activity: Insufficiently Active (05/07/2023)   Exercise Vital Sign    Days of  Exercise per Week: 4 days    Minutes of Exercise per Session: 30 min  Stress: No Stress Concern Present (05/07/2023)   [employer] of Occupational Health - Occupational Stress Questionnaire    Feeling of Stress : Only a little  Social Connections: Socially Isolated (05/07/2023)   Social Connection and Isolation Panel [NHANES]    Frequency of Communication with Friends and Family: Once a week    Frequency of Social Gatherings with Friends and Family: Once a week    Attends Religious Services: Never    Database administrator or Organizations: No    Attends Engineer, structural: Not on file    Marital Status: Married  Catering manager Violence: Not At Risk (06/12/2022)   Received from [facility]   HITS    Over the last 12 months how often did your partner physically hurt you?: Never    Over the last 12 months how often did your partner insult you or talk down to you?: Never    Over the last 12 months how often did your partner threaten you with physical harm?: Never    Over the last 12 months how often did your partner scream or curse at you?: Never     Constitutional: Pt reports headache, chills and fatigue. Denies fever, malaise, or abrupt weight changes.  HEENT: Pt reports sinus pressure, runny nose, nasal congestion, ear pain and sore throat. Denies eye pain, eye redness, ringing in the ears, wax buildup, bloody nose. Respiratory: Pt reports cough. Denies difficulty breathing, shortness of breath.   Cardiovascular: Denies chest pain, chest tightness, palpitations or swelling in the hands or feet.  Gastrointestinal: Denies abdominal pain, bloating, constipation, diarrhea or blood in the stool.  Musculoskeletal: Pt reports body aches. Denies decrease in range of motion, difficulty with gait, or joint swelling.  Skin: Denies redness, rashes, lesions or ulcercations.  Neurological: Denies dizziness, difficulty with memory, difficulty with speech or problems with balance and  coordination.   No other specific complaints in a complete review of systems (except as listed in HPI above).      Objective:   Physical Exam  BP 110/62 (BP Location: Left Arm, Patient Position: Sitting, Cuff Size: Normal)   Ht 5\' 8"  (1.727 m)   Wt 190 lb 12.8 oz (86.5 kg)   LMP 11/20/2019 (Approximate)   BMI 29.01 kg/m   Wt Readings from Last 3 Encounters:  04/30/23 195 lb 9.6 oz (88.7 kg)  02/15/23 185 lb (83.9 kg)  10/04/22 189 lb (85.7 kg)    General: Appears her stated age, well developed, well nourished in NAD. Skin: Warm, dry and  intact. No rashes noted. HEENT: Head: normal shape and size, no sign this tenderness noted; Eyes: sclera white, no icterus, conjunctiva pink, PERRLA and EOMs intact; Ears: Tm's gray and intact, normal light reflex; Nose: mucosa pink and moist, septum midline; Throat/Mouth: Teeth present, mucosa pink and moist, + PND, no exudate, lesions or ulcerations noted.  Neck: No adenopathy noted. Cardiovascular: Normal rate and rhythm. S1,S2 noted.  No murmur, rubs or gallops noted.  Pulmonary/Chest: Normal effort and positive vesicular breath sounds. No respiratory distress. No wheezes, rales or ronchi noted.  Neurological: Alert and oriented.  BMET    Component Value Date/Time   NA 139 04/30/2023 0816   NA 142 01/22/2020 1121   K 4.8 04/30/2023 0816   CL 105 04/30/2023 0816   CO2 29 04/30/2023 0816   GLUCOSE 99 04/30/2023 0816   BUN 12 04/30/2023 0816   BUN 9 01/22/2020 1121   CREATININE 0.82 04/30/2023 0816   CALCIUM 9.4 04/30/2023 0816   GFRNONAA 91 01/22/2020 1121   GFRAA 105 01/22/2020 1121    Lipid Panel     Component Value Date/Time   CHOL 199 04/30/2023 0816   TRIG 153 (H) 04/30/2023 0816   HDL 53 04/30/2023 0816   CHOLHDL 3.8 04/30/2023 0816   VLDL 11.0 06/18/2017 1102   LDLCALC 119 (H) 04/30/2023 0816    CBC    Component Value Date/Time   WBC 6.3 04/30/2023 0816   RBC 4.64 04/30/2023 0816   HGB 13.5 04/30/2023 0816   HGB  14.6 01/22/2020 1124   HCT 41.9 04/30/2023 0816   HCT 42.7 01/22/2020 1124   PLT 293 04/30/2023 0816   PLT 264 01/22/2020 1124   MCV 90.3 04/30/2023 0816   MCV 89 01/22/2020 1124   MCH 29.1 04/30/2023 0816   MCHC 32.2 04/30/2023 0816   RDW 13.1 04/30/2023 0816   RDW 12.3 01/22/2020 1124    Hgb A1C Lab Results  Component Value Date   HGBA1C 5.7 (H) 04/30/2023            Assessment & Plan:  Assessment and Plan    Acute upper respiratory infection Likely viral infection due to clear nasal discharge and afebrile status. - Continue Zyrtec and ibuprofen . - Consider OTC intranasal corticosteroids like fluticasone or nasal. -Encourage rest and fluids - Provide work note for return tomorrow. - Advise to contact if symptoms worsen by week's end.  Allergic rhinitis Chronic condition managed with cetirizine. - Continue cetirizine daily.        RTC in 6 months for followup chronic conditions Helayne Lo, NP

## 2023-05-07 NOTE — Patient Instructions (Signed)
 Allergic Rhinitis, Adult  Allergic rhinitis is a reaction to allergens. Allergens are things that can cause an allergic reaction. This condition affects the lining inside the nose (mucous membrane). There are two types of allergic rhinitis: Seasonal. This type is also called hay fever. It happens only during some times of the year. Perennial. This type can happen at any time of the year. This condition cannot be spread from person to person (is not contagious). It can be mild, bad, or very bad. It can develop at any age and may be outgrown. What are the causes? Pollen from grasses, trees, and weeds. Other causes can be: Dust mites. Smoke. Mold. Car fumes. The pee (urine), spit, or dander of pets. Dander is dead skin cells from a pet. What increases the risk? You are more likely to develop this condition if: You have allergies in your family. You have problems like allergies in your family. You may have: Swelling of parts of your eyes and eyelids. Asthma. This affects how you breathe. Long-term redness and swelling on your skin. Food allergies. What are the signs or symptoms? The main symptom of this condition is a runny or stuffy nose (nasal congestion). Other symptoms may include: Sneezing or coughing. Itching and tearing of your eyes. Mucus that drips down the back of your throat (postnasal drip). This may cause a sore throat. Trouble sleeping. Feeling tired. Headache. How is this treated? There is no cure for this condition. You should avoid things that you are allergic to. Treatment can help to relieve symptoms. This may include: Medicines that block allergy symptoms, such as anti-inflammatories or antihistamines. These may be given as a shot, nasal spray, or pill. Avoiding things you are allergic to. Medicines that give you some of what you are allergic to over time. This is called immunotherapy. It is done if other treatments do not help. You may get: Shots. Medicine under  your tongue. Stronger medicines, if other treatments do not help. Follow these instructions at home: Avoiding allergens Find out what things you are allergic to and avoid them. To do this, try these things: If you get allergies any time of year: Replace carpet with wood, tile, or vinyl flooring. Carpet can trap pet dander and dust. Do not smoke. Do not allow smoking in your home. Change your heating and air conditioning filters at least once a month. If you get allergies only some times of the year: Keep windows closed when you can. Plan things to do outside when pollen counts are lowest. Check pollen counts before you plan things to do outside. When you come indoors, change your clothes and shower before you sit on furniture or bedding. If you are allergic to a pet: Keep the pet out of your bedroom. Vacuum, sweep, and dust often. General instructions Take over-the-counter and prescription medicines only as told by your doctor. Drink enough fluid to keep your pee pale yellow. Where to find more information American Academy of Allergy, Asthma & Immunology: aaaai.org Contact a doctor if: You have a fever. You get a cough that does not go away. You make high-pitched whistling sounds when you breathe, most often when you breathe out (wheeze). Your symptoms slow you down. Your symptoms stop you from doing your normal things each day. Get help right away if: You are short of breath. This symptom may be an emergency. Get help right away. Call 911. Do not wait to see if the symptom will go away. Do not drive yourself to the  hospital. This information is not intended to replace advice given to you by your health care provider. Make sure you discuss any questions you have with your health care provider. Document Revised: 09/05/2021 Document Reviewed: 09/05/2021 Elsevier Patient Education  2024 [employer].

## 2023-05-08 ENCOUNTER — Encounter: Payer: Self-pay | Admitting: Internal Medicine

## 2023-05-08 ENCOUNTER — Ambulatory Visit
Admission: RE | Admit: 2023-05-08 | Discharge: 2023-05-08 | Disposition: A | Discharge status: Home / Self Care | Source: Ambulatory Visit | Attending: Internal Medicine | Admitting: Internal Medicine

## 2023-05-08 ENCOUNTER — Telehealth: Payer: Self-pay

## 2023-05-08 DIAGNOSIS — Z1231 Encounter for screening mammogram for malignant neoplasm of breast: Secondary | ICD-10-CM | POA: Insufficient documentation

## 2023-05-08 NOTE — Telephone Encounter (Signed)
 Note done and uploaded to MyChart. Patient was advised via MyChart.

## 2023-05-08 NOTE — Telephone Encounter (Signed)
 Copied from CRM [id number]. Topic: General - Other >> May 08, 2023  8:45 AM Juluis Ok wrote: Reason for CRM: Patient requesting to have work excuse extended to today  Callback [phone]

## 2023-05-08 NOTE — Telephone Encounter (Signed)
 Okay to extend work note to go back tomorrow

## 2023-05-29 ENCOUNTER — Ambulatory Visit: Payer: Self-pay

## 2023-06-04 ENCOUNTER — Encounter: Payer: Self-pay | Admitting: Internal Medicine

## 2023-06-06 NOTE — Telephone Encounter (Signed)
 Do I have to put a separate referral to [facility] for the CPAP supplies. I thought once they had the sleep study that [facility] took care of the rest.

## 2023-06-21 ENCOUNTER — Telehealth: Payer: Self-pay

## 2023-06-21 NOTE — Telephone Encounter (Signed)
 Copied from CRM [id number]. Topic: General - Other >> Jun 20, 2023  4:54 PM Santiya F wrote: Reason for CRM: Patient is calling in because she had a sleep study done a while ago and says she has not received a CPAP. Patient says she wants to know is there something else she needs to do or if she needs to contact another company. Please advise.

## 2023-06-22 NOTE — Telephone Encounter (Signed)
 The patient is returning Breanna Tran call from yesterday regarding a cpap. Please assist her further and contact her today if possible

## 2023-06-22 NOTE — Telephone Encounter (Signed)
 I have filled out the form for the CPAP again to be sent back to [facility].

## 2023-06-26 ENCOUNTER — Encounter: Payer: Self-pay | Admitting: Internal Medicine

## 2023-06-26 DIAGNOSIS — M6289 Other specified disorders of muscle: Secondary | ICD-10-CM

## 2023-06-26 MED ORDER — HYDROCORTISONE ACETATE 25 MG RE SUPP: 25.0000 mg | Freq: Two times a day (BID) | RECTAL | 0 refills | Status: AC

## 2023-07-06 DIAGNOSIS — G4733 Obstructive sleep apnea (adult) (pediatric): Secondary | ICD-10-CM | POA: Diagnosis not present

## 2023-07-12 NOTE — Addendum Note (Signed)
 Addended by: ANTONETTE ANGELINE ORN on: 07/12/2023 10:11 AM   Modules accepted: Orders

## 2023-07-17 ENCOUNTER — Telehealth: Payer: Self-pay | Admitting: Internal Medicine

## 2023-07-17 NOTE — Telephone Encounter (Unsigned)
 Copied from CRM [id number]. Topic: Referral - Status >> Jul 17, 2023  4:43 PM Donee H wrote: Reason for CRM: Debbie with [facility] called to advised received referral for patient to have pelvis floor physical therapy. She states the [place] location do not offer this service. The closest location they have will be the [place] location that offer service. She provided number and address. If any further question Marval can be reached at [phone].  [facility] Location  [address] [place], [place] [address]  [phone]

## 2023-07-18 NOTE — Telephone Encounter (Signed)
 Covering for Angeline Laura, FNP   Levon, can you re-route this [facility] Pelvic Floor therapy order to switch locations?  They need the [place] location that offers Pelvic Floor PT.  Thank you!  Marsa Officer, DO [facility] 07/18/2023, 11:02 AM

## 2023-07-22 ENCOUNTER — Encounter: Payer: Self-pay | Admitting: Internal Medicine

## 2023-08-26 ENCOUNTER — Encounter: Payer: Self-pay | Admitting: Internal Medicine

## 2023-08-27 ENCOUNTER — Ambulatory Visit: Admitting: Internal Medicine

## 2023-08-27 ENCOUNTER — Ambulatory Visit: Payer: Self-pay

## 2023-08-27 NOTE — Telephone Encounter (Signed)
 Spoke with patient, she stated is fine and will keep her appointment for 9/3. Explained to her if she felt she needed medication or referral in the mean time to call and schedule a sooner appointment. Verbal understanding

## 2023-08-27 NOTE — Telephone Encounter (Signed)
 FYI Only or Action Required?: Action required by provider: referral request and clinical question for provider.  Patient was last seen in primary care on 05/07/2023 by Antonette Angeline ORN, NP.  Called Nurse Triage reporting Anxiety and mental health referral   Symptoms began yesterday.  Interventions attempted: Prescription medications: lexapro .  Symptoms are: unchanged.  Triage Disposition: See PCP When Office is Open (Within 3 Days)  Patient/caregiver understands and will follow disposition?: No, wishes to speak with PCP  Copied from CRM #[id number]. Topic: Clinical - Red Word Triage >> Aug 27, 2023 10:32 AM Donee H wrote: [facility] that prompted transfer to Nurse Triage: Patient is stating she is experiencing severe anxiety  She stated her husband of 20 years left her yesterday. She is wants her pcp to place her on a medication and sent her for mental health counseling Reason for Disposition  MODERATE anxiety (e.g., persistent or frequent anxiety symptoms; interferes with sleep, school, or work)  Answer Assessment - Initial Assessment Questions 1. CONCERN: Did anything happen that prompted you to call today?      Husband of 20 years left yesterday 2. ANXIETY SYMPTOMS: Can you describe how you (your loved one; patient) have been feeling? (e.g., tense, restless, panicky, anxious, keyed up, overwhelmed, sense of impending doom).      Overwhelmed and anxious 3. ONSET: How long have you been feeling this way? (e.g., hours, days, weeks)      4. SEVERITY: How would you rate the level of anxiety? (e.g., 0 - 10; or mild, moderate, severe).     Severe 5. FUNCTIONAL IMPAIRMENT: How have these feelings affected your ability to do daily activities? Have you had more difficulty than usual doing your normal daily activities? (e.g., getting better, same, worse; self-care, school, work, interactions)     Difficulty going to work  6. HISTORY: Have you felt this way before? Have you ever been  diagnosed with an anxiety problem in the past? (e.g., generalized anxiety disorder, panic attacks, PTSD). If Yes, ask: How was this problem treated? (e.g., medicines, counseling, etc.)     Yes  7. RISK OF HARM - SUICIDAL IDEATION: Do you ever have thoughts of hurting or killing yourself? If Yes, ask:  Do you have these feelings now? Do you have a plan on how you would do this?     Denies  8. TREATMENT:  What has been done so far to treat this anxiety? (e.g., medicines, relaxation strategies). What has helped?     Lexapro  9. THERAPIST: Do you have a counselor or therapist? If Yes, ask: What is their name?     Not at this time, requesting referral to Behavioral health 10. POTENTIAL TRIGGERS: Do you drink caffeinated beverages (e.g., coffee, colas, teas), and how much daily? Do you drink alcohol or use any drugs? Have you started any new medicines recently?       Spouse moved out 11. PATIENT SUPPORT: Who is with you now? Who do you live with? Do you have family or friends who you can talk to?         48. OTHER SYMPTOMS: Do you have any other symptoms? (e.g., feeling depressed, trouble concentrating, trouble sleeping, trouble breathing, palpitations or fast heartbeat, chest pain, sweating, nausea, or diarrhea)       Denies  Additional info 1) Requesting short term anxiety medication.   2) Offered next available acute visit but she declines, she would like to request from pcp a mental health referral. She states she has  previously been through a divorce and while doing well now she knows she will need emotional support and therapy.  Protocols used: Anxiety and Panic Attack-A-AH

## 2023-08-28 ENCOUNTER — Ambulatory Visit: Admitting: Internal Medicine

## 2023-09-12 ENCOUNTER — Ambulatory Visit: Admitting: Internal Medicine

## 2023-09-12 ENCOUNTER — Encounter: Payer: Self-pay | Admitting: Internal Medicine

## 2023-09-12 VITALS — BP 122/74 | Ht 68.0 in | Wt 186.8 lb

## 2023-09-12 DIAGNOSIS — F419 Anxiety disorder, unspecified: Secondary | ICD-10-CM

## 2023-09-12 DIAGNOSIS — Z6828 Body mass index (BMI) 28.0-28.9, adult: Secondary | ICD-10-CM

## 2023-09-12 DIAGNOSIS — E663 Overweight: Secondary | ICD-10-CM

## 2023-09-12 DIAGNOSIS — R7303 Prediabetes: Secondary | ICD-10-CM

## 2023-09-12 DIAGNOSIS — G4733 Obstructive sleep apnea (adult) (pediatric): Secondary | ICD-10-CM | POA: Insufficient documentation

## 2023-09-12 DIAGNOSIS — E7800 Pure hypercholesterolemia, unspecified: Secondary | ICD-10-CM | POA: Diagnosis not present

## 2023-09-12 DIAGNOSIS — F5104 Psychophysiologic insomnia: Secondary | ICD-10-CM

## 2023-09-12 MED ORDER — ESCITALOPRAM OXALATE 20 MG PO TABS
20.0000 mg | ORAL_TABLET | Freq: Every day | ORAL | 1 refills | Status: AC
Start: 2023-09-12 — End: ?

## 2023-09-12 MED ORDER — TRAZODONE HCL 50 MG PO TABS: 25.0000 mg | ORAL_TABLET | Freq: Every evening | ORAL | 3 refills | Status: AC | PRN

## 2023-09-12 NOTE — Patient Instructions (Signed)
 Managing Anxiety, Adult  After being diagnosed with anxiety, you may be relieved to know why you have felt or behaved a certain way. You may also feel overwhelmed about the treatment ahead and what it will mean for your life. With care and support, you can manage your anxiety.  How to manage lifestyle changes  Understanding the difference between stress and anxiety  Although stress can play a role in anxiety, it is not the same as anxiety. Stress is your body's reaction to life changes and events, both good and bad. Stress is often caused by something external, such as a deadline, test, or competition. It normally goes away after the event has ended and will last just a few hours. But, stress can be ongoing and can lead to more than just stress.  Anxiety is caused by something internal, such as imagining a terrible outcome or worrying that something will go wrong that will greatly upset you. Anxiety often does not go away even after the event is over, and it can become a long-term (chronic) worry.  Lowering stress and anxiety    Talk with your health care provider or a counselor to learn more about lowering anxiety and stress. They may suggest tension-reduction techniques, such as:  Music. Spend time creating or listening to music that you enjoy and that inspires you.  Mindfulness-based meditation. Practice being aware of your normal breaths while not trying to control your breathing. It can be done while sitting or walking.  Centering prayer. Focus on a word, phrase, or sacred image that means something to you and brings you peace.  Deep breathing. Expand your stomach and inhale slowly through your nose. Hold your breath for 3-5 seconds. Then breathe out slowly, letting your stomach muscles relax.  Self-talk. Learn to notice and spot thought patterns that lead to anxiety reactions. Change those patterns to thoughts that feel peaceful.  Muscle relaxation. Take time to tense muscles and then relax them.  Choose a  tension-reduction technique that fits your lifestyle and personality. These techniques take time and practice. Set aside 5-15 minutes a day to do them. Specialized therapists can offer counseling and training in these techniques. The training to help with anxiety may be covered by some insurance plans.  Other things you can do to manage stress and anxiety include:  Keeping a stress diary. This can help you learn what triggers your reaction and then learn ways to manage your response.  Thinking about how you react to certain situations. You may not be able to control everything, but you can control your response.  Making time for activities that help you relax and not feeling guilty about spending your time in this way.  Doing visual imagery. This involves imagining or creating mental pictures to help you relax.  Practicing yoga. Through yoga poses, you can lower tension and relax.     Medicines  Medicines for anxiety include:  Antidepressant medicines. These are usually prescribed for long-term daily control.  Anti-anxiety medicines. These may be added in severe cases, especially when panic attacks occur.  When used together, medicines, psychotherapy, and tension-reduction techniques may be the most effective treatment.  Relationships  Relationships can play a big part in helping you recover. Spend more time connecting with trusted friends and family members. Think about going to couples counseling if you have a partner, taking family education classes, or going to family therapy. Therapy can help you and others better understand your anxiety.  How to recognize changes in  your anxiety  Everyone responds differently to treatment for anxiety. Recovery from anxiety happens when symptoms lessen and stop interfering with your daily life at home or work. This may mean that you will start to:  Have better concentration and focus. Worry will interfere less in your daily thinking.  Sleep better.  Be less irritable.  Have  more energy.  Have improved memory.  Try to recognize when your condition is getting worse. Contact your provider if your symptoms interfere with home or work and you feel like your condition is not improving.  Follow these instructions at home:  Activity  Exercise. Adults should:  Exercise for at least 150 minutes each week. The exercise should increase your heart rate and make you sweat (moderate-intensity exercise).  Do strengthening exercises at least twice a week.  Get the right amount and quality of sleep. Most adults need 7-9 hours of sleep each night.  Lifestyle    Eat a healthy diet that includes plenty of vegetables, fruits, whole grains, low-fat dairy products, and lean protein.  Do not eat a lot of foods that are high in fats, added sugars, or salt (sodium).  Make choices that simplify your life.  Do not use any products that contain nicotine or tobacco. These products include cigarettes, chewing tobacco, and vaping devices, such as e-cigarettes. If you need help quitting, ask your provider.  Avoid caffeine, alcohol, and certain over-the-counter cold medicines. These may make you feel worse. Ask your pharmacist which medicines to avoid.  General instructions  Take over-the-counter and prescription medicines only as told by your provider.  Keep all follow-up visits. This is to make sure you are managing your anxiety well or if you need more support.  Where to find support  You can get help and support from:  Self-help groups.  Online and [employer].  A trusted spiritual leader.  Couples counseling.  Family education classes.  Family therapy.  Where to find more information  You may find that joining a support group helps you deal with your anxiety. The following sources can help you find counselors or support groups near you:  Mental Health America: mentalhealthamerica.net  Anxiety and Depression Association of [place] (ADAA): adaa.org  The [employer] on Mental Illness (NAMI):  nami.org  Contact a health care provider if:  You have a hard time staying focused or finishing tasks.  You spend many hours a day feeling worried about everyday life.  You are very tired because you cannot stop worrying.  You start to have headaches or often feel tense.  You have chronic nausea or diarrhea.  Get help right away if:  Your heart feels like it is racing.  You have shortness of breath.  You have thoughts of hurting yourself or others.  Get help right away if you feel like you may hurt yourself or others, or have thoughts about taking your own life. Go to your nearest emergency room or:  Call 911.  Call the National Suicide Prevention Lifeline at (562)309-0957 or 988. This is open 24 hours a day.  Text the Crisis Text Line at 786-142-3811.  This information is not intended to replace advice given to you by your health care provider. Make sure you discuss any questions you have with your health care provider.  Document Revised: 10/04/2021 Document Reviewed: 04/18/2020  Elsevier Patient Education  2024 [employer].

## 2023-09-12 NOTE — Assessment & Plan Note (Signed)
 Deteriorated Rx for trazodone  25 to 50 mg nightly as needed We will monitor

## 2023-09-12 NOTE — Progress Notes (Signed)
 Subjective:    Patient ID: Breanna Tran, female    DOB: 07-08-1970, 53 y.o.   MRN: [id number]  HPI  Patient presents to clinic today for follow-up of chronic conditions.  Anxiety: Chronic, managed on escitalopram .  She does feel like this has been worse lately as she recently got separated.  She is not currently seeing a therapist but would like a referral for one.  She denies depression, SI/HI.  Insomnia: Deteriorated.  She is able to fall asleep but is unable to stay asleep.  She is not taking any medications for this but would like to be prescribed something.  Sleep study from 04/2023 reviewed.  HLD: Her last LDL was 119, triglycerides 153, 04/2023.  She is not taking any cholesterol-lowering medication at this time.  She does not consume a low-fat diet.  Prediabetes: Her last A1c was 5.7%, 04/2023.  She is not taking any oral diabetic medication at this time.  She does not check her sugars.  OSA: She averages 4 hours of night with her CPAP.  She reports now that she is separated, she is no longer able to afford her CPAP machine.  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  Review of Systems  Past Medical History:  Diagnosis Date   Genital warts due to HPV (human papillomavirus) 2000   wart removed.    Gestational diabetes    2 nd pregnancy only   PONV (postoperative nausea and vomiting)    Strep throat    10/18    Current Outpatient Medications  Medication Sig Dispense Refill   cetirizine (ZYRTEC) 10 MG tablet Take 10 mg by mouth daily.     escitalopram  (LEXAPRO ) 10 MG tablet Take 1 tablet (10 mg total) by mouth daily. 90 tablet 1   hydrocortisone  (ANUSOL -HC) 25 MG suppository Place 1 suppository (25 mg total) rectally 2 (two) times daily. 28 suppository 0   ibuprofen  (ADVIL ) 200 MG tablet Take 200 mg by mouth every 6 (six) hours as needed.     polyethylene glycol (MIRALAX  / GLYCOLAX ) 17 g packet Take 17 g by mouth daily.     RUTIN PO  Take 1,000 mg by mouth.     No current facility-administered medications for this visit.    Allergies  Allergen Reactions   Augmentin [Amoxicillin -Pot Clavulanate] Diarrhea    Family History  Problem Relation Age of Onset   Graves' disease Mother    Renal cancer Father    Healthy Sister    Lung cancer Maternal Grandfather    Brain cancer Maternal Uncle    Breast cancer Neg Hx     Social History   Socioeconomic History   Marital status: Married    Spouse name: Not on file   Number of children: Not on file   Years of education: Not on file   Highest education level: Associate degree: occupational, Scientist, product/process development, or vocational program  Occupational History   Not on file  Tobacco Use   Smoking status: Former    Current packs/day: 0.00    Average packs/day: 0.5 packs/day for 15.0 years (7.5 ttl pk-yrs)    Types: E-cigarettes, Cigarettes    Start date: 01/09/1997    Quit date: 01/10/2012    Years since quitting: 11.6   Smokeless tobacco: Never  Vaping Use   Vaping status: Former  Substance and Sexual Activity   Alcohol use: Yes    Alcohol/week: 1.0 standard drink of alcohol  Types: 1 Glasses of wine per week    Comment: occ   Drug use: No   Sexual activity: Yes    Birth control/protection: Condom  Other Topics Concern   Not on file  Social History Narrative   Not on file   Social Drivers of Health   Financial Resource Strain: Medium Risk (05/07/2023)   Overall Financial Resource Strain (CARDIA)    Difficulty of Paying Living Expenses: Somewhat hard  Food Insecurity: Food Insecurity Present (05/07/2023)   Hunger Vital Sign    Worried About Running Out of Food in the Last Year: Sometimes true    Ran Out of Food in the Last Year: Sometimes true  Transportation Needs: No Transportation Needs (05/07/2023)   PRAPARE - Administrator, Civil Service (Medical): No    Lack of Transportation (Non-Medical): No  Physical Activity: Insufficiently Active (05/07/2023)    Exercise Vital Sign    Days of Exercise per Week: 4 days    Minutes of Exercise per Session: 30 min  Stress: No Stress Concern Present (05/07/2023)   [employer] of Occupational Health - Occupational Stress Questionnaire    Feeling of Stress : Only a little  Social Connections: Socially Isolated (05/07/2023)   Social Connection and Isolation Panel    Frequency of Communication with Friends and Family: Once a week    Frequency of Social Gatherings with Friends and Family: Once a week    Attends Religious Services: Never    Database administrator or Organizations: No    Attends Engineer, structural: Not on file    Marital Status: Married  Catering manager Violence: Not At Risk (06/12/2022)   Received from [facility]   HITS    Over the last 12 months how often did your partner physically hurt you?: Never    Over the last 12 months how often did your partner insult you or talk down to you?: Never    Over the last 12 months how often did your partner threaten you with physical harm?: Never    Over the last 12 months how often did your partner scream or curse at you?: Never     Constitutional: Denies fever, malaise, fatigue, headache or abrupt weight changes.  HEENT: Denies eye pain, eye redness, ear pain, ringing in the ears, wax buildup, runny nose, nasal congestion, bloody nose, or sore throat. Respiratory: Denies difficulty breathing, shortness of breath, cough or sputum production.   Cardiovascular: Denies chest pain, chest tightness, palpitations or swelling in the hands or feet.  Gastrointestinal: Denies abdominal pain, bloating, constipation, diarrhea or blood in the stool.  GU: Denies urgency, frequency, pain with urination, burning sensation, blood in urine, odor or discharge. Musculoskeletal: Denies decrease in range of motion, difficulty with gait, muscle pain or joint pain and swelling.  Skin: Denies redness, rashes, lesions or ulcercations.  Neurological: Patient  reports insomnia.  Denies dizziness, difficulty with memory, difficulty with speech or problems with balance and coordination.  Psych: Patient has a history of anxiety.  Denies depression, SI/HI.  No other specific complaints in a complete review of systems (except as listed in HPI above).     Objective:   Physical Exam  BP 122/74 (BP Location: Left Arm, Patient Position: Sitting, Cuff Size: Normal)   Ht 5' 8 (1.727 m)   Wt 186 lb 12.8 oz (84.7 kg)   LMP 11/20/2019 (Approximate)   BMI 28.40 kg/m    Wt Readings from Last 3 Encounters:  05/07/23  190 lb 12.8 oz (86.5 kg)  04/30/23 195 lb 9.6 oz (88.7 kg)  02/15/23 185 lb (83.9 kg)    General: Appears her stated age, overweight, in NAD. Skin: Warm, dry and intact.  HEENT: Head: normal shape and size; Eyes: sclera white, no icterus, conjunctiva pink, PERRLA and EOMs intact;  Cardiovascular: Normal rate and rhythm. S1,S2 noted.  No murmur, rubs or gallops noted. No JVD or BLE edema. No carotid bruits noted. Pulmonary/Chest: Normal effort and positive vesicular breath sounds. No respiratory distress. No wheezes, rales or ronchi noted.  Abdomen: Soft and nontender. Normal bowel sounds.  Musculoskeletal: No difficulty with gait.  Neurological: Alert and oriented. Cranial nerves II-XII grossly intact. Coordination normal.  Psychiatric: Tearful. Judgment and thought content normal.    BMET    Component Value Date/Time   NA 139 04/30/2023 0816   NA 142 01/22/2020 1121   K 4.8 04/30/2023 0816   CL 105 04/30/2023 0816   CO2 29 04/30/2023 0816   GLUCOSE 99 04/30/2023 0816   BUN 12 04/30/2023 0816   BUN 9 01/22/2020 1121   CREATININE 0.82 04/30/2023 0816   CALCIUM 9.4 04/30/2023 0816   GFRNONAA 91 01/22/2020 1121   GFRAA 105 01/22/2020 1121    Lipid Panel     Component Value Date/Time   CHOL 199 04/30/2023 0816   TRIG 153 (H) 04/30/2023 0816   HDL 53 04/30/2023 0816   CHOLHDL 3.8 04/30/2023 0816   VLDL 11.0 06/18/2017 1102    LDLCALC 119 (H) 04/30/2023 0816    CBC    Component Value Date/Time   WBC 6.3 04/30/2023 0816   RBC 4.64 04/30/2023 0816   HGB 13.5 04/30/2023 0816   HGB 14.6 01/22/2020 1124   HCT 41.9 04/30/2023 0816   HCT 42.7 01/22/2020 1124   PLT 293 04/30/2023 0816   PLT 264 01/22/2020 1124   MCV 90.3 04/30/2023 0816   MCV 89 01/22/2020 1124   MCH 29.1 04/30/2023 0816   MCHC 32.2 04/30/2023 0816   RDW 13.1 04/30/2023 0816   RDW 12.3 01/22/2020 1124    Hgb A1C Lab Results  Component Value Date   HGBA1C 5.7 (H) 04/30/2023            Assessment & Plan:      RTC in 6 months for your annual exam Angeline Laura, NP

## 2023-09-12 NOTE — Assessment & Plan Note (Signed)
 She will no longer be using her CPAP and is considering a dental device Encourage weight loss as this can help reduce sleep apnea symptoms

## 2023-09-12 NOTE — Assessment & Plan Note (Signed)
 C-Met and lipid profile today Encouraged her to consume a low-fat diet

## 2023-09-12 NOTE — Assessment & Plan Note (Signed)
 Deteriorated Will increase escitalopram  to 20 mg daily Referral to psychologist placed for therapy Support offered

## 2023-09-12 NOTE — Assessment & Plan Note (Signed)
 A1c today Encourage low-carb diet and exercise for weight loss

## 2023-09-12 NOTE — Assessment & Plan Note (Signed)
 Encouraged diet and exercise for weight loss ?

## 2023-09-13 ENCOUNTER — Ambulatory Visit: Payer: Self-pay | Admitting: Internal Medicine

## 2023-09-13 LAB — LIPID PANEL
Cholesterol: 242 mg/dL — ABNORMAL HIGH (ref <200)
HDL: 60 mg/dL (ref >=50)
LDL Cholesterol (Calc): 160 mg/dL — ABNORMAL HIGH
Non-HDL Cholesterol (Calc): 182 mg/dL — ABNORMAL HIGH (ref <130)
Total CHOL/HDL Ratio: 4 (calc) (ref <5.0)
Triglycerides: 107 mg/dL (ref <150)

## 2023-09-13 LAB — CBC
HCT: 41.9 % (ref 35.0–45.0)
Hemoglobin: 13.5 g/dL (ref 11.7–15.5)
MCH: 29.4 pg (ref 27.0–33.0)
MCHC: 32.2 g/dL (ref 32.0–36.0)
MCV: 91.3 fL (ref 80.0–100.0)
MPV: 10.5 fL (ref 7.5–12.5)
Platelets: 287 Thousand/uL (ref 140–400)
RBC: 4.59 Million/uL (ref 3.80–5.10)
RDW: 12.8 % (ref 11.0–15.0)
WBC: 7.3 Thousand/uL (ref 3.8–10.8)

## 2023-09-13 LAB — COMPREHENSIVE METABOLIC PANEL WITH GFR
AG Ratio: 1.6 (calc) (ref 1.0–2.5)
ALT: 20 U/L (ref 6–29)
AST: 17 U/L (ref 10–35)
Albumin: 4.3 g/dL (ref 3.6–5.1)
Alkaline phosphatase (APISO): 58 U/L (ref 37–153)
BUN: 17 mg/dL (ref 7–25)
CO2: 28 mmol/L (ref 20–32)
Calcium: 9.6 mg/dL (ref 8.6–10.4)
Chloride: 105 mmol/L (ref 98–110)
Creat: 0.81 mg/dL (ref 0.50–1.03)
Globulin: 2.7 g/dL (ref 1.9–3.7)
Glucose, Bld: 115 mg/dL (ref 65–139)
Potassium: 5 mmol/L (ref 3.5–5.3)
Sodium: 141 mmol/L (ref 135–146)
Total Bilirubin: 0.2 mg/dL (ref 0.2–1.2)
Total Protein: 7 g/dL (ref 6.1–8.1)
eGFR: 88 mL/min/1.73m2 (ref >=60)

## 2023-09-13 LAB — HEMOGLOBIN A1C
Hgb A1c MFr Bld: 6 % — ABNORMAL HIGH (ref <5.7)
Mean Plasma Glucose: 126 mg/dL
eAG (mmol/L): 7 mmol/L

## 2023-10-04 ENCOUNTER — Other Ambulatory Visit: Payer: Self-pay | Admitting: Internal Medicine

## 2023-10-05 NOTE — Telephone Encounter (Signed)
 Requested Prescriptions  Refused Prescriptions Disp Refills   traZODone  (DESYREL ) 50 MG tablet [Pharmacy Med Name: TRAZODONE  50 MG TABLET] 90 tablet 2    Sig: TAKE 0.5-1 TABLETS BY MOUTH AT BEDTIME AS NEEDED FOR SLEEP.     Psychiatry: Antidepressants - Serotonin Modulator Passed - 10/05/2023 12:16 PM      Passed - Valid encounter within last 6 months    Recent Outpatient Visits           3 weeks ago Anxiety   [facility] [place], Angeline ORN, NP   5 months ago Viral URI with cough   [facility] [place], Angeline ORN, NP   5 months ago Encounter for general adult medical examination with abnormal findings    [facility] [place], Angeline ORN, [place]

## 2023-10-16 ENCOUNTER — Encounter: Admitting: Physical Therapy

## 2023-10-22 ENCOUNTER — Encounter: Payer: Self-pay | Admitting: Internal Medicine

## 2023-10-23 ENCOUNTER — Encounter: Admitting: Physical Therapy

## 2023-10-30 ENCOUNTER — Encounter: Admitting: Physical Therapy

## 2023-10-30 ENCOUNTER — Ambulatory Visit: Admitting: Internal Medicine

## 2023-11-04 ENCOUNTER — Other Ambulatory Visit: Payer: Self-pay | Admitting: Internal Medicine

## 2023-11-06 ENCOUNTER — Encounter: Admitting: Physical Therapy

## 2023-11-06 NOTE — Telephone Encounter (Signed)
 Requested medication (s) are due for refill today: No  Requested medication (s) are on the active medication list: Yes  Last refill:  09/12/23  Future visit scheduled: Yes  Notes to clinic:  See pharmacy request.    Requested Prescriptions  Pending Prescriptions Disp Refills   traZODone  (DESYREL ) 50 MG tablet [Pharmacy Med Name: TRAZODONE  50 MG TABLET] 90 tablet 2    Sig: TAKE 0.5-1 TABLETS BY MOUTH AT BEDTIME AS NEEDED FOR SLEEP.     Psychiatry: Antidepressants - Serotonin Modulator Passed - 11/06/2023 12:05 PM      Passed - Valid encounter within last 6 months    Recent Outpatient Visits           1 month ago Anxiety   [facility] [place], Angeline ORN, NP   6 months ago Viral URI with cough   [facility] [facility] [place], Angeline ORN, NP   6 months ago Encounter for general adult medical examination with abnormal findings    [facility] [place], Angeline ORN, NP

## 2023-11-12 ENCOUNTER — Ambulatory Visit: Admitting: Internal Medicine

## 2024-01-24 ENCOUNTER — Encounter: Payer: Self-pay | Admitting: Internal Medicine

## 2024-01-24 ENCOUNTER — Ambulatory Visit: Admitting: Internal Medicine

## 2024-01-24 VITALS — BP 118/68 | HR 60 | Ht 68.0 in | Wt 193.0 lb

## 2024-01-24 DIAGNOSIS — J069 Acute upper respiratory infection, unspecified: Secondary | ICD-10-CM | POA: Diagnosis not present

## 2024-01-24 LAB — POC COVID19/FLU A&B COMBO
Covid Antigen, POC: NEGATIVE
Influenza A Antigen, POC: NEGATIVE
Influenza B Antigen, POC: NEGATIVE

## 2024-01-24 NOTE — Progress Notes (Signed)
 "  Subjective:    Patient ID: Marko LITTIE Grates, female    DOB: 1970-12-31, 54 y.o.   MRN: [id number]  HPI   Discussed the use of AI scribe software for clinical note transcription with the patient, who gave verbal consent to proceed.  CORETHA CRESWELL is a 54 year old female who presents with symptoms of a viral upper respiratory infection.  She has been experiencing symptoms since yesterday, including headache, sinus pressure, rhinorrhea, nasal congestion, sneezing, myalgia, and fatigue. The nasal discharge is described as 'a lot of clear snot'.  Additional symptoms include otalgia, pharyngitis, and cough. No dysphagia or dyspnea. She also reports diarrhea but no nausea or vomiting. There is no fever, but she feels cold and shaky upon waking.  Her daughter and daughter's father have been ill, with the father confirmed to have influenza. Her daughter began feeling unwell a couple of days ago and worsened this morning. She has not received a flu vaccination this year.  She has been managing her symptoms with ibuprofen  and nasal spray. She is concerned about her job security due to illness-related absences, as her workplace requires a doctor's note if she is too sick to work.      Past Medical History:  Diagnosis Date   Genital warts due to HPV (human papillomavirus) 2000   wart removed.    Gestational diabetes    2 nd pregnancy only   PONV (postoperative nausea and vomiting)    Strep throat    10/18    Current Outpatient Medications  Medication Sig Dispense Refill   cetirizine (ZYRTEC) 10 MG tablet Take 10 mg by mouth daily.     escitalopram  (LEXAPRO ) 20 MG tablet Take 1 tablet (20 mg total) by mouth daily. 90 tablet 1   hydrocortisone  (ANUSOL -HC) 25 MG suppository Place 1 suppository (25 mg total) rectally 2 (two) times daily. (Patient taking differently: Place 25 mg rectally as needed.) 28 suppository 0   ibuprofen  (ADVIL ) 200 MG tablet Take 200 mg by mouth every 6 (six) hours as  needed.     polyethylene glycol (MIRALAX  / GLYCOLAX ) 17 g packet Take 17 g by mouth daily.     RUTIN PO Take 1,000 mg by mouth. (Patient not taking: Reported on 09/12/2023)     traZODone  (DESYREL ) 50 MG tablet Take 0.5-1 tablets (25-50 mg total) by mouth at bedtime as needed for sleep. 30 tablet 3   No current facility-administered medications for this visit.    Allergies[1]  Family History  Problem Relation Age of Onset   Graves' disease Mother    Renal cancer Father    Healthy Sister    Lung cancer Maternal Grandfather    Brain cancer Maternal Uncle    Breast cancer Neg Hx     Social History   Socioeconomic History   Marital status: Married    Spouse name: Not on file   Number of children: Not on file   Years of education: Not on file   Highest education level: Associate degree: occupational, scientist, product/process development, or vocational program  Occupational History   Not on file  Tobacco Use   Smoking status: Former    Current packs/day: 0.00    Average packs/day: 0.5 packs/day for 15.0 years (7.5 ttl pk-yrs)    Types: E-cigarettes, Cigarettes    Start date: 01/09/1997    Quit date: 01/10/2012    Years since quitting: 12.0   Smokeless tobacco: Never  Vaping Use   Vaping status: Former  Substance and  Sexual Activity   Alcohol use: Yes    Alcohol/week: 1.0 standard drink of alcohol    Types: 1 Glasses of wine per week    Comment: occ   Drug use: No   Sexual activity: Yes    Birth control/protection: Condom  Other Topics Concern   Not on file  Social History Narrative   Not on file   Social Drivers of Health   Tobacco Use: Medium Risk (09/12/2023)   Patient History    Smoking Tobacco Use: Former    Smokeless Tobacco Use: Never    Passive Exposure: Not on file  Financial Resource Strain: Medium Risk (05/07/2023)   Overall Financial Resource Strain (CARDIA)    Difficulty of Paying Living Expenses: Somewhat hard  Food Insecurity: Food Insecurity Present (05/07/2023)   Hunger Vital  Sign    Worried About Running Out of Food in the Last Year: Sometimes true    Ran Out of Food in the Last Year: Sometimes true  Transportation Needs: No Transportation Needs (05/07/2023)   PRAPARE - Administrator, Civil Service (Medical): No    Lack of Transportation (Non-Medical): No  Physical Activity: Insufficiently Active (05/07/2023)   Exercise Vital Sign    Days of Exercise per Week: 4 days    Minutes of Exercise per Session: 30 min  Stress: No Stress Concern Present (05/07/2023)   [employer] of Occupational Health - Occupational Stress Questionnaire    Feeling of Stress : Only a little  Social Connections: Socially Isolated (05/07/2023)   Social Connection and Isolation Panel    Frequency of Communication with Friends and Family: Once a week    Frequency of Social Gatherings with Friends and Family: Once a week    Attends Religious Services: Never    Database Administrator or Organizations: No    Attends Engineer, Structural: Not on file    Marital Status: Married  Catering Manager Violence: Not At Risk (06/12/2022)   Received from [facility]   HITS    Over the last 12 months how often did your partner physically hurt you?: Never    Over the last 12 months how often did your partner insult you or talk down to you?: Never    Over the last 12 months how often did your partner threaten you with physical harm?: Never    Over the last 12 months how often did your partner scream or curse at you?: Never  Depression (PHQ2-9): High Risk (09/12/2023)   Depression (PHQ2-9)    PHQ-2 Score: 16  Alcohol Screen: Low Risk (05/07/2023)   Alcohol Screen    Last Alcohol Screening Score (AUDIT): 0  Housing: Low Risk (05/07/2023)   Housing Stability Vital Sign    Unable to Pay for Housing in the Last Year: No    Number of Times Moved in the Last Year: 0    Homeless in the Last Year: No  Utilities: Not At Risk (06/12/2022)   Received [facility]     Threatened with loss of utilities: No  Health Literacy: Not on file     Constitutional: Pt reports fatigue, headache. Denies fever, malaise, or abrupt weight changes.  HEENT: Pt reports sinus pressure, runny nose, nasal congestion, ear pain and sore throat. Denies eye pain, eye redness, ringing in the ears, wax buildup, bloody nose. Respiratory: Pt reports cough. Denies difficulty breathing, shortness of breath, or sputum production.   Cardiovascular: Denies chest pain, chest tightness,  palpitations or swelling in the hands or feet.  Gastrointestinal: Pt reports diarrhea. Denies abdominal pain, bloating, constipation, or blood in the stool.  Musculoskeletal: Pt reports body aches. Denies decrease in range of motion, difficulty with gait, or joint swelling.  Skin: Denies redness, rashes, lesions or ulcercations.  Neurological: Pt reports insomnia. Denies dizziness, difficulty with memory, difficulty with speech or problems with balance and coordination.  Psych: Pt has a history of anxiety. Denies depression, SI/HI.  No other specific complaints in a complete review of systems (except as listed in HPI above).  Objective   BP 118/68 (BP Location: Right Arm, Patient Position: Sitting, Cuff Size: Normal)   Pulse 60   Ht 5' 8 (1.727 m)   Wt 193 lb (87.5 kg)   LMP 11/20/2019   SpO2 99%   BMI 29.35 kg/m   Wt Readings from Last 3 Encounters:  09/12/23 186 lb 12.8 oz (84.7 kg)  05/07/23 190 lb 12.8 oz (86.5 kg)  04/30/23 195 lb 9.6 oz (88.7 kg)    General: Appears her stated age, overweight, in NAD. Skin: Warm, dry and intact. No rashes noted. HEENT: Head: normal shape and size, maxillary sinus tenderness noted; Eyes: sclera white, no icterus, conjunctiva pink, PERRLA and EOMs intact; Ears: Bilateral cerumen impaction; Nose: mucosa pink and moist, septum midline; Throat/Mouth: Teeth present, mucosa pink and moist, + PND, no exudate, lesions or ulcerations noted.  Neck: No adenopathy  noted. Cardiovascular: Normal rate and rhythm. S1,S2 noted.  No murmur, rubs or gallops noted.  Pulmonary/Chest: Normal effort and positive vesicular breath sounds. No respiratory distress. No wheezes, rales or ronchi noted.  Musculoskeletal:  No difficulty with gait.  Neurological: Alert and oriented. Coordination normal.     BMET    Component Value Date/Time   NA 141 09/12/2023 0832   NA 142 01/22/2020 1121   K 5.0 09/12/2023 0832   CL 105 09/12/2023 0832   CO2 28 09/12/2023 0832   GLUCOSE 115 09/12/2023 0832   BUN 17 09/12/2023 0832   BUN 9 01/22/2020 1121   CREATININE 0.81 09/12/2023 0832   CALCIUM 9.6 09/12/2023 0832   GFRNONAA 91 01/22/2020 1121   GFRAA 105 01/22/2020 1121    Lipid Panel     Component Value Date/Time   CHOL 242 (H) 09/12/2023 0832   TRIG 107 09/12/2023 0832   HDL 60 09/12/2023 0832   CHOLHDL 4.0 09/12/2023 0832   VLDL 11.0 06/18/2017 1102   LDLCALC 160 (H) 09/12/2023 0832    CBC    Component Value Date/Time   WBC 7.3 09/12/2023 0832   RBC 4.59 09/12/2023 0832   HGB 13.5 09/12/2023 0832   HGB 14.6 01/22/2020 1124   HCT 41.9 09/12/2023 0832   HCT 42.7 01/22/2020 1124   PLT 287 09/12/2023 0832   PLT 264 01/22/2020 1124   MCV 91.3 09/12/2023 0832   MCV 89 01/22/2020 1124   MCH 29.4 09/12/2023 0832   MCHC 32.2 09/12/2023 0832   RDW 12.8 09/12/2023 0832   RDW 12.3 01/22/2020 1124    Hgb A1C Lab Results  Component Value Date   HGBA1C 6.0 (H) 09/12/2023       Assessment and Plan   Assessment and Plan    Viral URI with cough. Likely viral etiology. Negative COVID-19 and influenza tests. Antibiotics not indicated. -Encouraged rest and fluids - Continue nasal spray for sinus pressure, rhinorrhea, and nasal congestion. - Consider Zyrtec or Allegra for postnasal drip. -She declines Rx cough suppressant at this time -  Provided work note for return on Monday.        RTC in 2 months for followup chronic conditions  Angeline Laura,  NP     [1]  Allergies Allergen Reactions   Augmentin [Amoxicillin -Pot Clavulanate] Diarrhea   "

## 2024-01-24 NOTE — Patient Instructions (Signed)

## 2024-02-12 ENCOUNTER — Encounter: Payer: Self-pay | Admitting: Internal Medicine

## 2024-02-13 ENCOUNTER — Telehealth: Payer: Self-pay

## 2024-02-13 NOTE — Telephone Encounter (Signed)
 yes

## 2024-02-13 NOTE — Telephone Encounter (Signed)
 Form completion, virtual visit.

## 2024-02-21 ENCOUNTER — Ambulatory Visit: Admitting: Internal Medicine

## 2024-04-30 ENCOUNTER — Encounter: Admitting: Internal Medicine
